# Patient Record
Sex: Female | Born: 1995 | Race: Black or African American | Hispanic: No | Marital: Single | Smoking: Never smoker
Health system: Southern US, Community
[De-identification: ages and names within clinical notes are randomized; demographics above are authoritative.]

## PROBLEM LIST (undated history)

## (undated) HISTORY — PX: NO PAST SURGERIES: SHX2092

---
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
## 2013-05-21 ENCOUNTER — Other Ambulatory Visit (HOSPITAL_COMMUNITY)
Admission: RE | Admit: 2013-05-21 | Discharge: 2013-05-21 | Disposition: A | Discharge status: Home / Self Care | Payer: BC Managed Care – PPO | Source: Ambulatory Visit | Attending: Family Medicine | Admitting: Family Medicine

## 2013-05-21 DIAGNOSIS — Z01419 Encounter for gynecological examination (general) (routine) without abnormal findings: Secondary | ICD-10-CM | POA: Insufficient documentation

## 2014-11-30 ENCOUNTER — Inpatient Hospital Stay (HOSPITAL_COMMUNITY): Admission: AD | Admit: 2014-11-30 | Payer: BC Managed Care – PPO | Admitting: Family Medicine

## 2014-11-30 ENCOUNTER — Emergency Department (HOSPITAL_COMMUNITY)
Admission: EM | Admit: 2014-11-30 | Discharge: 2014-11-30 | Disposition: A | Discharge status: Home / Self Care | Payer: Private Health Insurance - Indemnity | Attending: Emergency Medicine | Admitting: Emergency Medicine

## 2014-11-30 ENCOUNTER — Encounter (HOSPITAL_COMMUNITY): Payer: Self-pay | Admitting: Nurse Practitioner

## 2014-11-30 DIAGNOSIS — Z3202 Encounter for pregnancy test, result negative: Secondary | ICD-10-CM | POA: Insufficient documentation

## 2014-11-30 DIAGNOSIS — R630 Anorexia: Secondary | ICD-10-CM | POA: Diagnosis not present

## 2014-11-30 DIAGNOSIS — R109 Unspecified abdominal pain: Secondary | ICD-10-CM | POA: Diagnosis not present

## 2014-11-30 DIAGNOSIS — R110 Nausea: Secondary | ICD-10-CM | POA: Diagnosis not present

## 2014-11-30 DIAGNOSIS — R51 Headache: Secondary | ICD-10-CM | POA: Diagnosis not present

## 2014-11-30 DIAGNOSIS — R42 Dizziness and giddiness: Secondary | ICD-10-CM | POA: Insufficient documentation

## 2014-11-30 DIAGNOSIS — R531 Weakness: Secondary | ICD-10-CM | POA: Insufficient documentation

## 2014-11-30 DIAGNOSIS — R5383 Other fatigue: Secondary | ICD-10-CM | POA: Diagnosis not present

## 2014-11-30 LAB — CBC WITH DIFFERENTIAL/PLATELET
BASOS ABS: 0 10*3/uL (ref 0.0–0.1)
Basophils Relative: 1 % (ref 0–1)
Eosinophils Absolute: 0 10*3/uL (ref 0.0–0.7)
Eosinophils Relative: 1 % (ref 0–5)
HCT: 37.8 % (ref 36.0–46.0)
Hemoglobin: 12.1 g/dL (ref 12.0–15.0)
Lymphocytes Relative: 41 % (ref 12–46)
Lymphs Abs: 1.8 10*3/uL (ref 0.7–4.0)
MCH: 25.4 pg — ABNORMAL LOW (ref 26.0–34.0)
MCHC: 32 g/dL (ref 30.0–36.0)
MCV: 79.4 fL (ref 78.0–100.0)
Monocytes Absolute: 0.3 10*3/uL (ref 0.1–1.0)
Monocytes Relative: 8 % (ref 3–12)
NEUTROS ABS: 2.2 10*3/uL (ref 1.7–7.7)
Neutrophils Relative %: 49 % (ref 43–77)
Platelets: 232 10*3/uL (ref 150–400)
RBC: 4.76 MIL/uL (ref 3.87–5.11)
RDW: 15.5 % (ref 11.5–15.5)
WBC: 4.3 10*3/uL (ref 4.0–10.5)

## 2014-11-30 LAB — URINALYSIS, ROUTINE W REFLEX MICROSCOPIC
Bilirubin Urine: NEGATIVE
GLUCOSE, UA: NEGATIVE mg/dL
Hgb urine dipstick: NEGATIVE
KETONES UR: NEGATIVE mg/dL
Leukocytes, UA: NEGATIVE
Nitrite: NEGATIVE
Protein, ur: NEGATIVE mg/dL
Specific Gravity, Urine: 1.027 (ref 1.005–1.030)
Urobilinogen, UA: 0.2 mg/dL (ref 0.0–1.0)
pH: 7 (ref 5.0–8.0)

## 2014-11-30 LAB — I-STAT BETA HCG BLOOD, ED (MC, WL, AP ONLY): I-stat hCG, quantitative: <5 m[IU]/mL (ref <5)

## 2014-11-30 LAB — COMPREHENSIVE METABOLIC PANEL
ALT: 10 U/L (ref 0–35)
AST: 17 U/L (ref 0–37)
Albumin: 4.1 g/dL (ref 3.5–5.2)
Alkaline Phosphatase: 64 U/L (ref 39–117)
Anion gap: 13 (ref 5–15)
BILIRUBIN TOTAL: 0.3 mg/dL (ref 0.3–1.2)
BUN: 14 mg/dL (ref 6–23)
CO2: 24 meq/L (ref 19–32)
Calcium: 9.6 mg/dL (ref 8.4–10.5)
Chloride: 104 mEq/L (ref 96–112)
Creatinine, Ser: 0.64 mg/dL (ref 0.50–1.10)
GFR calc Af Amer: >90 mL/min (ref >90)
Glucose, Bld: 81 mg/dL (ref 70–99)
Potassium: 4.1 mEq/L (ref 3.7–5.3)
SODIUM: 141 meq/L (ref 137–147)
Total Protein: 8.4 g/dL — ABNORMAL HIGH (ref 6.0–8.3)

## 2014-11-30 LAB — POC URINE PREG, ED: Preg Test, Ur: NEGATIVE

## 2014-11-30 LAB — LIPASE, BLOOD: Lipase: 24 U/L (ref 11–59)

## 2014-11-30 MED ORDER — ONDANSETRON 4 MG PO TBDP
8.0000 mg | ORAL_TABLET | Freq: Once | ORAL | Status: AC
  Administered 2014-11-30: 8 mg via ORAL
  Filled 2014-11-30: qty 2

## 2014-11-30 NOTE — ED Notes (Signed)
Called lab to verify that they would add on Lipase

## 2014-11-30 NOTE — ED Notes (Signed)
NAD present. Pt is stable.

## 2014-11-30 NOTE — ED Provider Notes (Signed)
CSN: [id number]     Arrival date & time 11/30/14  1048 History   First MD Initiated Contact with Patient 11/30/14 1241     Chief Complaint  Patient presents with  . Nausea     (Consider location/radiation/quality/duration/timing/severity/associated sxs/prior Treatment) HPI Sharon Gross is a 18 y.o. female with no medical problems, presents to ED with complaint of nausea, headaches, dizziness, loss of appetite for about 2 wks. States currently on her period. Reports mild pain in right flank. No fever, chills. No urinary symptoms. No cough or congestion. No abdominal pain. No vomiting. Normal bowel movements. Denies pregnancy although is sexually active. State "I just dont feel well." reports sleeping a lot. Drinking fluids.   History reviewed. No pertinent past medical history. History reviewed. No pertinent past surgical history. History reviewed. No pertinent family history. History  Substance Use Topics  . Smoking status: Never Smoker   . Smokeless tobacco: Not on file  . Alcohol Use: No   OB History    No data available     Review of Systems  Constitutional: Positive for appetite change and fatigue. Negative for fever and chills.  Respiratory: Negative for cough, chest tightness and shortness of breath.   Cardiovascular: Negative for chest pain, palpitations and leg swelling.  Gastrointestinal: Positive for nausea. Negative for vomiting, abdominal pain and diarrhea.  Genitourinary: Positive for flank pain. Negative for dysuria, urgency, hematuria, vaginal bleeding, vaginal discharge, vaginal pain and pelvic pain.  Musculoskeletal: Negative for myalgias, arthralgias, neck pain and neck stiffness.  Skin: Negative for rash.  Neurological: Positive for dizziness, weakness, light-headedness and headaches.  All other systems reviewed and are negative.     Allergies  Review of patient's allergies indicates no known allergies.  Home Medications   Prior to Admission  medications   Not on File   BP 121/65 mmHg  Pulse 75  Temp(Src) 98.3 F (36.8 C) (Oral)  Resp 16  SpO2 100%  LMP 11/26/2014 Physical Exam  Constitutional: She appears well-developed and well-nourished. No distress.  HENT:  Head: Normocephalic.  Eyes: Conjunctivae are normal.  Neck: Neck supple.  Cardiovascular: Normal rate, regular rhythm and normal heart sounds.   Pulmonary/Chest: Effort normal and breath sounds normal. No respiratory distress. She has no wheezes. She has no rales.  Abdominal: Soft. Bowel sounds are normal. She exhibits no distension. There is no tenderness. There is no rebound and no guarding.  No CVA tenderness bilaterally  Musculoskeletal: She exhibits no edema.  Neurological: She is alert.  Skin: Skin is warm and dry.  Psychiatric: She has a normal mood and affect. Her behavior is normal.  Nursing note and vitals reviewed.   ED Course  Procedures (including critical care time) Labs Review Labs Reviewed  CBC WITH DIFFERENTIAL - Abnormal; Notable for the following:    MCH 25.4 (*)    All other components within normal limits  COMPREHENSIVE METABOLIC PANEL - Abnormal; Notable for the following:    Total Protein 8.4 (*)    All other components within normal limits  URINALYSIS, ROUTINE W REFLEX MICROSCOPIC  LIPASE, BLOOD  POC URINE PREG, ED  I-STAT BETA HCG BLOOD, ED ([facility], [facility], [facility] ONLY)    Imaging Review No results found.   EKG Interpretation None      MDM   Final diagnoses:  Nausea  Weakness     2:02 PM Pt's labs, ua, orthostatics negative. VS normal. Discussed with pt. Pt requesting me to speak with her aunt who [facility]. She is requesting HCG blood lab test. I explained to her it is not necessary given urine preg is negative and even if it returns positive she will still be d/c home with follow up outpatient. Pt requesting the test stating she does not want to leave until we run it. Will get POC HCG. No vaginal discharge  or bleeding at this time.    HCG negative. Pt stable for dc home at this time with outpatient follow up and further work up as needed.   Filed Vitals:   11/30/14 1415 11/30/14 1430 11/30/14 1445 11/30/14 1501  BP: 129/68   129/68  Pulse: 78 90 82 82  Temp:      TempSrc:      Resp:      SpO2: 99% 99% 100%      Lottie Musselatyana A Yolonda Purtle, PA-C 11/30/14 1611  Glynn OctaveStephen Rancour, MD 11/30/14 [phone]

## 2014-11-30 NOTE — Discharge Instructions (Signed)
Lab work and urine analysis are normal today. You are not pregnant. Make sure to eat well balanced meals. Drink plenty of fluids. Follow up with primary care doctor. Return if symptoms worsening.    Fatigue Fatigue is a feeling of tiredness, lack of energy, lack of motivation, or feeling tired all the time. Having enough rest, good nutrition, and reducing stress will normally reduce fatigue. Consult your caregiver if it persists. The nature of your fatigue will help your caregiver to find out its cause. The treatment is based on the cause.  CAUSES  There are many causes for fatigue. Most of the time, fatigue can be traced to one or more of your habits or routines. Most causes fit into one or more of three general areas. They are: Lifestyle problems  Sleep disturbances.  Overwork.  Physical exertion.  Unhealthy habits.  Poor eating habits or eating disorders.  Alcohol and/or drug use .  Lack of proper nutrition (malnutrition). Psychological problems  Stress and/or anxiety problems.  Depression.  Grief.  Boredom. Medical Problems or Conditions  Anemia.  Pregnancy.  Thyroid gland problems.  Recovery from major surgery.  Continuous pain.  Emphysema or asthma that is not well controlled  Allergic conditions.  Diabetes.  Infections (such as mononucleosis).  Obesity.  Sleep disorders, such as sleep apnea.  Heart failure or other heart-related problems.  Cancer.  Kidney disease.  Liver disease.  Effects of certain medicines such as antihistamines, cough and cold remedies, prescription pain medicines, heart and blood pressure medicines, drugs used for treatment of cancer, and some antidepressants. SYMPTOMS  The symptoms of fatigue include:   Lack of energy.  Lack of drive (motivation).  Drowsiness.  Feeling of indifference to the surroundings. DIAGNOSIS  The details of how you feel help guide your caregiver in finding out what is causing the fatigue.  You will be asked about your present and past health condition. It is important to review all medicines that you take, including prescription and non-prescription items. A thorough exam will be done. You will be questioned about your feelings, habits, and normal lifestyle. Your caregiver may suggest blood tests, urine tests, or other tests to look for common medical causes of fatigue.  TREATMENT  Fatigue is treated by correcting the underlying cause. For example, if you have continuous pain or depression, treating these causes will improve how you feel. Similarly, adjusting the dose of certain medicines will help in reducing fatigue.  HOME CARE INSTRUCTIONS   Try to get the required amount of good sleep every night.  Eat a healthy and nutritious diet, and drink enough water throughout the day.  Practice ways of relaxing (including yoga or meditation).  Exercise regularly.  Make plans to change situations that cause stress. Act on those plans so that stresses decrease over time. Keep your work and personal routine reasonable.  Avoid street drugs and minimize use of alcohol.  Start taking a daily multivitamin after consulting your caregiver. SEEK MEDICAL CARE IF:   You have persistent tiredness, which cannot be accounted for.  You have fever.  You have unintentional weight loss.  You have headaches.  You have disturbed sleep throughout the night.  You are feeling sad.  You have constipation.  You have dry skin.  You have gained weight.  You are taking any new or different medicines that you suspect are causing fatigue.  You are unable to sleep at night.  You develop any unusual swelling of your legs or other parts of your  body. SEEK IMMEDIATE MEDICAL CARE IF:   You are feeling confused.  Your vision is blurred.  You feel faint or pass out.  You develop severe headache.  You develop severe abdominal, pelvic, or back pain.  You develop chest pain, shortness of  breath, or an irregular or fast heartbeat.  You are unable to pass a normal amount of urine.  You develop abnormal bleeding such as bleeding from the rectum or you vomit blood.  You have thoughts about harming yourself or committing suicide.  You are worried that you might harm someone else. MAKE SURE YOU:   Understand these instructions.  Will watch your condition.  Will get help right away if you are not doing well or get worse. Document Released: 10/01/2007 Document Revised: 02/26/2012 Document Reviewed: 04/07/2014 [facility] Patient Information 2015 [employer] [place]. This information is not intended to replace advice given to you by your health care provider. Make sure you discuss any questions you have with your health care provider.

## 2014-11-30 NOTE — ED Notes (Addendum)
She is tearful, states shes been having nausea, headaches, dizziness, loss of appetite and just not feeling well this week. She reports she started her period early and it was lighter than normal and a different color. She started bleeding Thursday and stopped yesterday. She is A&Ox4, resp e/u

## 2018-07-05 ENCOUNTER — Emergency Department (HOSPITAL_BASED_OUTPATIENT_CLINIC_OR_DEPARTMENT_OTHER)
Admission: EM | Admit: 2018-07-05 | Discharge: 2018-07-05 | Disposition: A | Discharge status: Home / Self Care | Payer: BLUE CROSS/BLUE SHIELD | Attending: Emergency Medicine | Admitting: Emergency Medicine

## 2018-07-05 ENCOUNTER — Other Ambulatory Visit: Payer: Self-pay

## 2018-07-05 ENCOUNTER — Encounter (HOSPITAL_BASED_OUTPATIENT_CLINIC_OR_DEPARTMENT_OTHER): Payer: Self-pay | Admitting: Emergency Medicine

## 2018-07-05 ENCOUNTER — Emergency Department (HOSPITAL_BASED_OUTPATIENT_CLINIC_OR_DEPARTMENT_OTHER): Payer: BLUE CROSS/BLUE SHIELD

## 2018-07-05 DIAGNOSIS — Z3202 Encounter for pregnancy test, result negative: Secondary | ICD-10-CM | POA: Diagnosis not present

## 2018-07-05 DIAGNOSIS — K8020 Calculus of gallbladder without cholecystitis without obstruction: Secondary | ICD-10-CM

## 2018-07-05 DIAGNOSIS — R1011 Right upper quadrant pain: Secondary | ICD-10-CM

## 2018-07-05 DIAGNOSIS — R1013 Epigastric pain: Secondary | ICD-10-CM | POA: Diagnosis present

## 2018-07-05 LAB — CBC
HCT: 39.4 % (ref 36.0–46.0)
HEMOGLOBIN: 13.1 g/dL (ref 12.0–15.0)
MCH: 27.9 pg (ref 26.0–34.0)
MCHC: 33.2 g/dL (ref 30.0–36.0)
MCV: 83.8 fL (ref 78.0–100.0)
Platelets: 161 10*3/uL (ref 150–400)
RBC: 4.7 MIL/uL (ref 3.87–5.11)
RDW: 15 % (ref 11.5–15.5)
WBC: 8.2 10*3/uL (ref 4.0–10.5)

## 2018-07-05 LAB — URINALYSIS, ROUTINE W REFLEX MICROSCOPIC
Glucose, UA: NEGATIVE mg/dL
Hgb urine dipstick: NEGATIVE
Ketones, ur: NEGATIVE mg/dL
NITRITE: NEGATIVE
Protein, ur: NEGATIVE mg/dL
Specific Gravity, Urine: 1.025 (ref 1.005–1.030)
pH: 6 (ref 5.0–8.0)

## 2018-07-05 LAB — COMPREHENSIVE METABOLIC PANEL
ALT: 128 U/L — AB (ref 0–44)
AST: 318 U/L — ABNORMAL HIGH (ref 15–41)
Albumin: 4 g/dL (ref 3.5–5.0)
Alkaline Phosphatase: 99 U/L (ref 38–126)
Anion gap: 9 (ref 5–15)
BUN: 15 mg/dL (ref 6–20)
CO2: 24 mmol/L (ref 22–32)
Calcium: 9.4 mg/dL (ref 8.9–10.3)
Chloride: 106 mmol/L (ref 98–111)
Creatinine, Ser: 0.76 mg/dL (ref 0.44–1.00)
GFR calc Af Amer: >60 mL/min (ref >60)
Glucose, Bld: 144 mg/dL — ABNORMAL HIGH (ref 70–99)
Potassium: 4.5 mmol/L (ref 3.5–5.1)
SODIUM: 139 mmol/L (ref 135–145)
TOTAL PROTEIN: 8.1 g/dL (ref 6.5–8.1)
Total Bilirubin: 1.2 mg/dL (ref 0.3–1.2)

## 2018-07-05 LAB — LIPASE, BLOOD: Lipase: 25 U/L (ref 11–51)

## 2018-07-05 LAB — URINALYSIS, MICROSCOPIC (REFLEX)

## 2018-07-05 LAB — PREGNANCY, URINE: PREG TEST UR: NEGATIVE

## 2018-07-05 MED ORDER — GI COCKTAIL ~~LOC~~
30.0000 mL | Freq: Once | ORAL | Status: AC
  Administered 2018-07-05: 30 mL via ORAL
  Filled 2018-07-05: qty 30

## 2018-07-05 MED ORDER — ONDANSETRON 4 MG PO TBDP
4.0000 mg | ORAL_TABLET | Freq: Once | ORAL | Status: AC
  Administered 2018-07-05: 4 mg via ORAL
  Filled 2018-07-05: qty 1

## 2018-07-05 NOTE — Discharge Instructions (Signed)
Follow up with a general surgeon.  You may want to avoid fatty foods.   Try zantac twice a day for the next week or so.

## 2018-07-05 NOTE — ED Triage Notes (Signed)
Reports sent from [facility] with upper abdominal pain x 2 days.  Denies N/V/D.

## 2018-07-05 NOTE — ED Notes (Signed)
Pt returned from US

## 2018-07-05 NOTE — ED Provider Notes (Signed)
[facility] EMERGENCY DEPARTMENT Provider Note   CSN: [id number] Arrival date & time: 07/05/18  1458     History   Chief Complaint Chief Complaint  Patient presents with  . Abdominal Pain    HPI Sharon Gross is a 22 y.o. female.  22 yo F with a chief complaint of epigastric abdominal pain.  Going on since yesterday.  Worse with lying flat.  Denies fevers, vomiting.  No prior abdominal surgery.  Denies trauma.    The history is provided by the patient.  Abdominal Pain   This is a new problem. The current episode started yesterday. The problem occurs constantly. The problem has not changed since onset.The pain is located in the epigastric region. The quality of the pain is sharp and aching. The pain is at a severity of 8/10. The pain is moderate. Pertinent negatives include fever, nausea, vomiting, dysuria, headaches, arthralgias and myalgias. Nothing aggravates the symptoms. Nothing relieves the symptoms.    History reviewed. No pertinent past medical history.  There are no active problems to display for this patient.   History reviewed. No pertinent surgical history.   OB History   None      Home Medications    Prior to Admission medications   Not on File    Family History History reviewed. No pertinent family history.  Social History Social History   Tobacco Use  . Smoking status: Never Smoker  Substance Use Topics  . Alcohol use: No  . Drug use: No     Allergies   Patient has no known allergies.   Review of Systems Review of Systems  Constitutional: Negative for chills and fever.  HENT: Negative for congestion and rhinorrhea.   Eyes: Negative for redness and visual disturbance.  Respiratory: Negative for shortness of breath and wheezing.   Cardiovascular: Negative for chest pain and palpitations.  Gastrointestinal: Positive for abdominal distention and abdominal pain. Negative for nausea and vomiting.  Genitourinary: Negative for  dysuria and urgency.  Musculoskeletal: Negative for arthralgias and myalgias.  Skin: Negative for pallor and wound.  Neurological: Negative for dizziness and headaches.     Physical Exam Updated Vital Signs BP 131/85 (BP Location: Right Arm)   Pulse 82   Temp 98.6 F (37 C) (Oral)   Resp 18   Ht 5\' 7"  (1.702 m)   Wt 79.8 kg (176 lb)   LMP 06/21/2018 (Exact Date)   SpO2 100%   BMI 27.57 kg/m   Physical Exam  Constitutional: She is oriented to person, place, and time. She appears well-developed and well-nourished. No distress.  HENT:  Head: Normocephalic and atraumatic.  Eyes: Pupils are equal, round, and reactive to light. EOM are normal.  Neck: Normal range of motion. Neck supple.  Cardiovascular: Normal rate and regular rhythm. Exam reveals no gallop and no friction rub.  No murmur heard. Pulmonary/Chest: Effort normal. She has no wheezes. She has no rales.  Abdominal: Soft. She exhibits no distension and no mass. There is tenderness (mild epigastric). There is no guarding.  No RUQ pain negative murphys   Musculoskeletal: She exhibits no edema or tenderness.  Neurological: She is alert and oriented to person, place, and time.  Skin: Skin is warm and dry. She is not diaphoretic.  Psychiatric: She has a normal mood and affect. Her behavior is normal.  Nursing note and vitals reviewed.    ED Treatments / Results  Labs (all labs ordered are listed, but only abnormal results are displayed) Labs  Reviewed  COMPREHENSIVE METABOLIC PANEL - Abnormal; Notable for the following components:      Result Value   Glucose, Bld 144 (*)    AST 318 (*)    ALT 128 (*)    All other components within normal limits  URINALYSIS, ROUTINE W REFLEX MICROSCOPIC - Abnormal; Notable for the following components:   Bilirubin Urine SMALL (*)    Leukocytes, UA TRACE (*)    All other components within normal limits  URINALYSIS, MICROSCOPIC (REFLEX) - Abnormal; Notable for the following  components:   Bacteria, UA MANY (*)    All other components within normal limits  LIPASE, BLOOD  CBC  PREGNANCY, URINE    EKG None  Radiology Koreas Abdomen Limited Ruq  Result Date: 07/05/2018 CLINICAL DATA:  Epigastric region pain EXAM: ULTRASOUND ABDOMEN LIMITED RIGHT UPPER QUADRANT COMPARISON:  None. FINDINGS: Gallbladder: There are a few small echogenic foci in the gallbladder which move and shadow consistent with cholelithiasis. Largest gallstone measures 4 mm in length. No gallbladder wall thickening or pericholecystic fluid. No sonographic Murphy sign noted by sonographer. Common bile duct: Diameter: 3 mm. No intrahepatic or extrahepatic biliary duct dilatation. Liver: No focal lesion identified. Within normal limits in parenchymal echogenicity. Portal vein is patent on color Doppler imaging with normal direction of blood flow towards the liver. IMPRESSION: Cholelithiasis. No gallbladder wall thickening or pericholecystic fluid. Study otherwise unremarkable. Electronically Signed   By: Bretta BangWilliam  Woodruff III M.D.   On: 07/05/2018 18:35    Procedures Procedures (including critical care time)  Medications Ordered in ED Medications  ondansetron (ZOFRAN-ODT) disintegrating tablet 4 mg (4 mg Oral Given 07/05/18 1625)  gi cocktail (Maalox,Lidocaine,Donnatal) (30 mLs Oral Given 07/05/18 1625)     Initial Impression / Assessment and Plan / ED Course  I have reviewed the triage vital signs and the nursing notes.  Pertinent labs & imaging results that were available during my care of the patient were reviewed by me and considered in my medical decision making (see chart for details).  Clinical Course as of Jul 05 1850  Fri Jul 05, 2018  1746 Patient has mild AST and ALT elevation not seen on prior will obtain right upper quadrant ultrasound patient reassessed and feels completely better after GI cocktail.  UA with too numerous to count bacteria but otherwise unremarkable I doubt this is a  urinary tract infection.  CBC without anemia lipase normal   [DF]    Clinical Course User Index [DF] Melene PlanFloyd, Masyn Fullam, DO    22 yo F with a chief complaint of epigastric abdominal pain.  This been going on since yesterday.  Sounds like reflux by history.  Exam with no right upper quadrant tenderness negative Murphy sign.  Will obtain abdominal labs give a GI cocktail and reassess.  Patient has some LFT elevation, ultrasound ordered and negative for acute cholecystitis.  White blood cell count normal pain significantly improved with GI cocktail.  Suspect patient's symptoms are most likely reflux but with gallstones on ultrasound we will have her follow-up with general surgery.  6:51 PM:  I have discussed the diagnosis/risks/treatment options with the patient and family and believe the pt to be eligible for discharge home to follow-up with PCP, Gen surgery. We also discussed returning to the ED immediately if new or worsening sx occur. We discussed the sx which are most concerning (e.g., sudden worsening pain, fever, inability to tolerate by mouth) that necessitate immediate return. Medications administered to the patient during their visit and  any new prescriptions provided to the patient are listed below.  Medications given during this visit Medications  ondansetron (ZOFRAN-ODT) disintegrating tablet 4 mg (4 mg Oral Given 07/05/18 1625)  gi cocktail (Maalox,Lidocaine,Donnatal) (30 mLs Oral Given 07/05/18 1625)      The patient appears reasonably screen and/or stabilized for discharge and I doubt any other medical condition or other [facility] requiring further screening, evaluation, or treatment in the ED at this time prior to discharge.    Final Clinical Impressions(s) / ED Diagnoses   Final diagnoses:  Abdominal pain, RUQ  Calculus of gallbladder without cholecystitis without obstruction    ED Discharge Orders    None       Melene Plan, DO 07/05/18 1851

## 2018-07-05 NOTE — ED Notes (Signed)
Patient transported to Ultrasound 

## 2018-07-09 ENCOUNTER — Ambulatory Visit: Payer: Self-pay | Admitting: General Surgery

## 2018-07-11 ENCOUNTER — Other Ambulatory Visit: Payer: Self-pay

## 2018-07-11 ENCOUNTER — Encounter (HOSPITAL_COMMUNITY): Payer: Self-pay | Admitting: *Deleted

## 2018-07-11 MED ORDER — CEFOTETAN DISODIUM 2 G IJ SOLR
2.0000 g | INTRAMUSCULAR | Status: AC
  Administered 2018-07-12: 2 g via INTRAVENOUS
  Filled 2018-07-11: qty 2

## 2018-07-11 MED ORDER — CEFOTETAN DISODIUM 2 G IJ SOLR
2.0000 g | INTRAMUSCULAR | Status: DC
  Filled 2018-07-11: qty 2

## 2018-07-12 ENCOUNTER — Ambulatory Visit (HOSPITAL_COMMUNITY): Payer: BLUE CROSS/BLUE SHIELD

## 2018-07-12 ENCOUNTER — Ambulatory Visit (HOSPITAL_COMMUNITY): Payer: BLUE CROSS/BLUE SHIELD | Admitting: Anesthesiology

## 2018-07-12 ENCOUNTER — Encounter (HOSPITAL_COMMUNITY)
Admission: RE | Disposition: A | Discharge status: Home / Self Care | Payer: Self-pay | Source: Ambulatory Visit | Attending: General Surgery

## 2018-07-12 ENCOUNTER — Ambulatory Visit (HOSPITAL_COMMUNITY)
Admission: RE | Admit: 2018-07-12 | Discharge: 2018-07-12 | Disposition: A | Discharge status: Home / Self Care | Payer: BLUE CROSS/BLUE SHIELD | Source: Ambulatory Visit | Attending: General Surgery | Admitting: General Surgery

## 2018-07-12 DIAGNOSIS — Z419 Encounter for procedure for purposes other than remedying health state, unspecified: Secondary | ICD-10-CM

## 2018-07-12 DIAGNOSIS — Z87891 Personal history of nicotine dependence: Secondary | ICD-10-CM | POA: Diagnosis not present

## 2018-07-12 DIAGNOSIS — K8010 Calculus of gallbladder with chronic cholecystitis without obstruction: Secondary | ICD-10-CM | POA: Diagnosis not present

## 2018-07-12 DIAGNOSIS — K8020 Calculus of gallbladder without cholecystitis without obstruction: Secondary | ICD-10-CM | POA: Diagnosis present

## 2018-07-12 HISTORY — PX: CHOLECYSTECTOMY: SHX55

## 2018-07-12 LAB — POCT PREGNANCY, URINE: PREG TEST UR: NEGATIVE

## 2018-07-12 SURGERY — LAPAROSCOPIC CHOLECYSTECTOMY WITH INTRAOPERATIVE CHOLANGIOGRAM
Anesthesia: General | Site: Abdomen

## 2018-07-12 MED ORDER — ROCURONIUM BROMIDE 100 MG/10ML IV SOLN
INTRAVENOUS | Status: DC | PRN
  Administered 2018-07-12: 50 mg via INTRAVENOUS

## 2018-07-12 MED ORDER — OXYCODONE HCL 5 MG PO TABS: 5.0000 mg | ORAL_TABLET | Freq: Four times a day (QID) | ORAL | 0 refills | Status: AC | PRN

## 2018-07-12 MED ORDER — BUPIVACAINE-EPINEPHRINE (PF) 0.25% -1:200000 IJ SOLN
INTRAMUSCULAR | Status: AC
  Filled 2018-07-12: qty 30

## 2018-07-12 MED ORDER — OXYCODONE HCL 5 MG PO TABS
5.0000 mg | ORAL_TABLET | Freq: Once | ORAL | Status: AC | PRN
Start: 2018-07-12 — End: 2018-07-12
  Administered 2018-07-12: 5 mg via ORAL

## 2018-07-12 MED ORDER — KETAMINE HCL 10 MG/ML IJ SOLN
INTRAMUSCULAR | Status: DC | PRN
  Administered 2018-07-12 (×2): 10 mg via INTRAVENOUS

## 2018-07-12 MED ORDER — DEXAMETHASONE SODIUM PHOSPHATE 10 MG/ML IJ SOLN
INTRAMUSCULAR | Status: DC | PRN
  Administered 2018-07-12: 10 mg via INTRAVENOUS

## 2018-07-12 MED ORDER — PHENYLEPHRINE 40 MCG/ML (10ML) SYRINGE FOR IV PUSH (FOR BLOOD PRESSURE SUPPORT)
PREFILLED_SYRINGE | INTRAVENOUS | Status: AC
  Filled 2018-07-12: qty 10

## 2018-07-12 MED ORDER — KETAMINE HCL 50 MG/5ML IJ SOSY
PREFILLED_SYRINGE | INTRAMUSCULAR | Status: AC
  Filled 2018-07-12: qty 5

## 2018-07-12 MED ORDER — OXYCODONE HCL 5 MG/5ML PO SOLN: 5.0000 mg | Freq: Once | ORAL | Status: AC | PRN

## 2018-07-12 MED ORDER — GABAPENTIN 300 MG PO CAPS
300.0000 mg | ORAL_CAPSULE | ORAL | Status: AC
  Administered 2018-07-12: 300 mg via ORAL
  Filled 2018-07-12: qty 1

## 2018-07-12 MED ORDER — FENTANYL CITRATE (PF) 100 MCG/2ML IJ SOLN
INTRAMUSCULAR | Status: AC
  Filled 2018-07-12: qty 2

## 2018-07-12 MED ORDER — CEFOXITIN SODIUM-DEXTROSE 1-4 GM-%(50ML) IV SOLR (PREMIX)
INTRAVENOUS | Status: AC
  Filled 2018-07-12: qty 50

## 2018-07-12 MED ORDER — GLYCOPYRROLATE 0.2 MG/ML IJ SOLN
INTRAMUSCULAR | Status: DC | PRN
  Administered 2018-07-12: 0.1 mg via INTRAVENOUS

## 2018-07-12 MED ORDER — PROPOFOL 10 MG/ML IV BOLUS
INTRAVENOUS | Status: DC | PRN
  Administered 2018-07-12: 200 mg via INTRAVENOUS

## 2018-07-12 MED ORDER — MIDAZOLAM HCL 2 MG/2ML IJ SOLN
INTRAMUSCULAR | Status: AC
  Filled 2018-07-12: qty 2

## 2018-07-12 MED ORDER — EPHEDRINE SULFATE 50 MG/ML IJ SOLN
INTRAMUSCULAR | Status: AC
  Filled 2018-07-12: qty 1

## 2018-07-12 MED ORDER — PROPOFOL 10 MG/ML IV BOLUS
INTRAVENOUS | Status: AC
  Filled 2018-07-12: qty 20

## 2018-07-12 MED ORDER — OXYCODONE HCL 5 MG PO TABS
ORAL_TABLET | ORAL | Status: AC
  Filled 2018-07-12: qty 1

## 2018-07-12 MED ORDER — FENTANYL CITRATE (PF) 100 MCG/2ML IJ SOLN
25.0000 ug | INTRAMUSCULAR | Status: DC | PRN
Start: 2018-07-12 — End: 2018-07-12
  Administered 2018-07-12: 50 ug via INTRAVENOUS

## 2018-07-12 MED ORDER — CELECOXIB 200 MG PO CAPS
200.0000 mg | ORAL_CAPSULE | ORAL | Status: AC
  Administered 2018-07-12: 200 mg via ORAL
  Filled 2018-07-12: qty 1

## 2018-07-12 MED ORDER — BUPIVACAINE-EPINEPHRINE 0.25% -1:200000 IJ SOLN
INTRAMUSCULAR | Status: DC | PRN
  Administered 2018-07-12: 30 mL

## 2018-07-12 MED ORDER — IOPAMIDOL (ISOVUE-300) INJECTION 61%
INTRAVENOUS | Status: AC
  Filled 2018-07-12: qty 50

## 2018-07-12 MED ORDER — 0.9 % SODIUM CHLORIDE (POUR BTL) OPTIME
TOPICAL | Status: DC | PRN
  Administered 2018-07-12: 1000 mL

## 2018-07-12 MED ORDER — LACTATED RINGERS IV SOLN
INTRAVENOUS | Status: DC
  Administered 2018-07-12 (×3): via INTRAVENOUS

## 2018-07-12 MED ORDER — PROPOFOL 500 MG/50ML IV EMUL
INTRAVENOUS | Status: DC | PRN
  Administered 2018-07-12: 25 ug/kg/min via INTRAVENOUS

## 2018-07-12 MED ORDER — SODIUM CHLORIDE 0.9 % IV SOLN
INTRAVENOUS | Status: DC | PRN
  Administered 2018-07-12: 10:00:00

## 2018-07-12 MED ORDER — SUGAMMADEX SODIUM 200 MG/2ML IV SOLN
INTRAVENOUS | Status: DC | PRN
  Administered 2018-07-12: 200 mg via INTRAVENOUS

## 2018-07-12 MED ORDER — CHLORHEXIDINE GLUCONATE CLOTH 2 % EX PADS: 6.0000 | MEDICATED_PAD | Freq: Once | CUTANEOUS | Status: DC

## 2018-07-12 MED ORDER — ONDANSETRON HCL 4 MG/2ML IJ SOLN
INTRAMUSCULAR | Status: DC | PRN
  Administered 2018-07-12: 4 mg via INTRAVENOUS

## 2018-07-12 MED ORDER — FENTANYL CITRATE (PF) 250 MCG/5ML IJ SOLN
INTRAMUSCULAR | Status: AC
  Filled 2018-07-12: qty 5

## 2018-07-12 MED ORDER — SODIUM CHLORIDE 0.9 % IR SOLN
Status: DC | PRN
  Administered 2018-07-12: 1000 mL

## 2018-07-12 MED ORDER — LIDOCAINE HCL (CARDIAC) PF 100 MG/5ML IV SOSY
PREFILLED_SYRINGE | INTRAVENOUS | Status: DC | PRN
  Administered 2018-07-12: 100 mg via INTRAVENOUS

## 2018-07-12 MED ORDER — FENTANYL CITRATE (PF) 100 MCG/2ML IJ SOLN
INTRAMUSCULAR | Status: DC | PRN
  Administered 2018-07-12: 150 ug via INTRAVENOUS
  Administered 2018-07-12: 25 ug via INTRAVENOUS

## 2018-07-12 MED ORDER — PROMETHAZINE HCL 25 MG/ML IJ SOLN: 6.2500 mg | INTRAMUSCULAR | Status: DC | PRN

## 2018-07-12 MED ORDER — ACETAMINOPHEN 500 MG PO TABS
1000.0000 mg | ORAL_TABLET | ORAL | Status: AC
  Administered 2018-07-12: 1000 mg via ORAL
  Filled 2018-07-12: qty 2

## 2018-07-12 MED ORDER — MIDAZOLAM HCL 5 MG/5ML IJ SOLN
INTRAMUSCULAR | Status: DC | PRN
  Administered 2018-07-12: 2 mg via INTRAVENOUS

## 2018-07-12 SURGICAL SUPPLY — 43 items
APPLIER CLIP 5 13 M/L LIGAMAX5 (MISCELLANEOUS) ×3
BLADE CLIPPER SURG (BLADE) ×3 IMPLANT
CANISTER SUCT 3000ML PPV (MISCELLANEOUS) ×3 IMPLANT
CHLORAPREP W/TINT 26ML (MISCELLANEOUS) ×3 IMPLANT
CLIP APPLIE 5 13 M/L LIGAMAX5 (MISCELLANEOUS) ×1 IMPLANT
CLOSURE WOUND 1/2 X4 (GAUZE/BANDAGES/DRESSINGS) ×1
COVER MAYO STAND STRL (DRAPES) ×3 IMPLANT
COVER SURGICAL LIGHT HANDLE (MISCELLANEOUS) ×3 IMPLANT
DERMABOND ADVANCED (GAUZE/BANDAGES/DRESSINGS) ×2
DERMABOND ADVANCED .7 DNX12 (GAUZE/BANDAGES/DRESSINGS) ×1 IMPLANT
DRAPE C-ARM 42X72 X-RAY (DRAPES) ×3 IMPLANT
DRSG TEGADERM 2-3/8X2-3/4 SM (GAUZE/BANDAGES/DRESSINGS) ×3 IMPLANT
ELECT REM PT RETURN 9FT ADLT (ELECTROSURGICAL) ×3
ELECTRODE REM PT RTRN 9FT ADLT (ELECTROSURGICAL) ×1 IMPLANT
GLOVE BIOGEL PI IND STRL 6 (GLOVE) ×1 IMPLANT
GLOVE BIOGEL PI IND STRL 8 (GLOVE) ×2 IMPLANT
GLOVE BIOGEL PI INDICATOR 6 (GLOVE) ×2
GLOVE BIOGEL PI INDICATOR 8 (GLOVE) ×4
GLOVE ECLIPSE 7.5 STRL STRAW (GLOVE) ×3 IMPLANT
GOWN STRL REUS W/ TWL LRG LVL3 (GOWN DISPOSABLE) ×3 IMPLANT
GOWN STRL REUS W/ TWL XL LVL3 (GOWN DISPOSABLE) ×1 IMPLANT
GOWN STRL REUS W/TWL LRG LVL3 (GOWN DISPOSABLE) ×6
GOWN STRL REUS W/TWL XL LVL3 (GOWN DISPOSABLE) ×2
KIT BASIN OR (CUSTOM PROCEDURE TRAY) ×3 IMPLANT
KIT TURNOVER KIT B (KITS) ×3 IMPLANT
NS IRRIG 1000ML POUR BTL (IV SOLUTION) ×3 IMPLANT
PAD ARMBOARD 7.5X6 YLW CONV (MISCELLANEOUS) ×3 IMPLANT
POUCH RETRIEVAL ECOSAC 10 (ENDOMECHANICALS) ×1 IMPLANT
POUCH RETRIEVAL ECOSAC 10MM (ENDOMECHANICALS) ×2
SCISSORS LAP 5X35 DISP (ENDOMECHANICALS) ×3 IMPLANT
SET CHOLANGIOGRAPH 5 50 .035 (SET/KITS/TRAYS/PACK) ×3 IMPLANT
SET IRRIG TUBING LAPAROSCOPIC (IRRIGATION / IRRIGATOR) ×3 IMPLANT
SLEEVE ENDOPATH XCEL 5M (ENDOMECHANICALS) ×6 IMPLANT
SPECIMEN JAR SMALL (MISCELLANEOUS) ×3 IMPLANT
STRIP CLOSURE SKIN 1/2X4 (GAUZE/BANDAGES/DRESSINGS) ×2 IMPLANT
SUT MNCRL AB 4-0 PS2 18 (SUTURE) ×3 IMPLANT
TOWEL OR 17X24 6PK STRL BLUE (TOWEL DISPOSABLE) IMPLANT
TOWEL OR 17X26 10 PK STRL BLUE (TOWEL DISPOSABLE) ×3 IMPLANT
TRAY LAPAROSCOPIC MC (CUSTOM PROCEDURE TRAY) ×3 IMPLANT
TROCAR XCEL BLUNT TIP 100MML (ENDOMECHANICALS) ×3 IMPLANT
TROCAR XCEL NON-BLD 5MMX100MML (ENDOMECHANICALS) ×3 IMPLANT
TUBING INSUFFLATION (TUBING) ×3 IMPLANT
WATER STERILE IRR 1000ML POUR (IV SOLUTION) ×3 IMPLANT

## 2018-07-12 NOTE — Op Note (Addendum)
OPERATIVE REPORT  DATE OF OPERATION: 07/12/2018  PATIENT:  Sharon Gross  22 y.o. female  PRE-OPERATIVE DIAGNOSIS:  Symptomatic cholelithiasis  POST-OPERATIVE DIAGNOSIS:  Symptomatic cholelithiasis  INDICATION(S) FOR OPERATION:  Symptomatic cholelithiasis with abnormal LFTs  FINDINGS:  IOC not normal but no intraductal filling defects  PROCEDURE:  Procedure(s): LAPAROSCOPIC CHOLECYSTECTOMY WITH INTRAOPERATIVE CHOLANGIOGRAM ERAS PATHWAY  SURGEON:  Surgeon(s): Jimmye NormanWyatt, Savaya Hakes, MD  ASSISTANT: None  ANESTHESIA:   general  COMPLICATIONS:  None  EBL: <20 ml  BLOOD ADMINISTERED: none  DRAINS: none   SPECIMEN:  Source of Specimen:  Gallbladder and contents  COUNTS CORRECT:  YES  PROCEDURE DETAILS: The patient was taken to the operating room and placed on the table in the supine position.  After an adequate endotracheal anesthetic was administered, the patient was prepped with ChloroPrep, and then draped in the usual manner exposing the entire abdomen laterally, inferiorly and up  to the costal margins.  After a proper timeout was performed including identifying the patient and the procedure to be performed, a supraumbilical 1.5cm midline incision was made using a #15 blade.  This was taken down to the fascia which was then incised with a #15 blade.  The edges of the fascia were tented up with Kocher clamps as the preperitoneal space was penetrated with a Kelly clamp into the peritoneum.  Once this was done, a pursestring suture of 0 Vicryl was passed around the fascial opening.  This was subsequently used to secure the [facility] cannula which was passed into the peritoneal cavity.  Once the [facility] cannula was in place, carbon dioxide gas was insufflated into the peritoneal cavity up to a maximal intra-abdominal pressure of 15mm Hg.The laparoscope, with attached camera and light source, was passed into the peritoneal cavity to visualize the direct insertion of two right upper quadrant  5mm cannulas, and a sup-xiphoid 5mm cannula.  Once all cannulas were in place, the dissection was begun.  Two ratcheted graspers were attached to the dome and infundibulum of the gallbladder and retracted towards the anterior abdominal wall and the right upper quadrant.  Using cautery attached to a dissecting forceps, the peritoneum overlaying the triangle of Chalot and the hepatoduodenal triangle was dissected away exposing the cystic duct and the cystic artery.  A critical window was developed between the CBD and the cystic duct The cystic artery was clipped proximally and distally then transected.  A clip was placed on the gallbladder side of the cystic duct, then a cholecystodochotomy made using the laparoscopic scissors.  Through the cholecystodochotomy a Cook catheter was passed to performed a cholangiogram.  The cholangiogram showed Godd proximal and distal flow, but could not visualize the flow into the duodenum  No filling defects noted..  Once the cholangiogram was completed, the [facility] catheter was removed, and the distal cystic duct was clipped threetimes then transected between the clips.  The gallbladder was then dissected out of the hepatic bed without event.  It was retrieved from the abdomen using an EcoPouch bag without event.  Once the gallbladder was removed, the bed was inspected for hemostasis.  Once excellent hemostasis was obtained all gas and fluids were aspirated from above the liver, then the cannulas were removed.  The supraumbilical incision was closed using the pursestring suture which was in place.  0.25% bupivicaine with epinephrine was injected at all sites.  All 10mm or greater cannula sites were close using a running subcuticular stitch of 4-0 Monocryl.  5.270mm cannula sites were closed with Dermabond  only.Steri-Strips and Tagaderm were used to complete the dressings at all sites.  At this point all needle, sponge, and instrument counts were correct.The patient was awakened  from anesthesia and taken to the PACU in stable condition.      [name]. Gae Bon, MD, FACS [phone] [id number] [facility]  PATIENT DISPOSITION:  PACU - hemodynamically stable.   Jimmye Norman 7/26/201910:51 AM

## 2018-07-12 NOTE — Discharge Instructions (Addendum)
Laparoscopic Cholecystectomy, Care After This sheet gives you information about how to care for yourself after your procedure. Your doctor may also give you more specific instructions. If you have problems or questions, contact your doctor. Follow these instructions at home: Care for cuts from surgery (incisions)   Follow instructions from your doctor about how to take care of your cuts from surgery. Make sure you: ? Wash your hands with soap and water before you change your bandage (dressing). If you cannot use soap and water, use hand sanitizer. ? Change your bandage as told by your doctor. ? Leave stitches (sutures), skin glue, or skin tape (adhesive) strips in place. They may need to stay in place for 2 weeks or longer. If tape strips get loose and curl up, you may trim the loose edges. Do not remove tape strips completely unless your doctor says it is okay.  Do not take baths, swim, or use a hot tub until your doctor says it is okay. Ask your doctor if you can take showers. You may only be allowed to take sponge baths for bathing.  Check your surgical cut area every day for signs of infection. Check for: ? More redness, swelling, or pain. ? More fluid or blood. ? Warmth. ? Pus or a bad smell. Activity  Do not drive or use heavy machinery while taking prescription pain medicine.  Do not lift anything that is heavier than 10 lb (4.5 kg) until your doctor says it is okay.  Do not play contact sports until your doctor says it is okay.  Do not drive for 24 hours if you were given a medicine to help you relax (sedative).  Rest as needed. Do not return to work or school until your doctor says it is okay. General instructions  Take over-the-counter and prescription medicines only as told by your doctor.  To prevent or treat constipation while you are taking prescription pain medicine, your doctor may recommend that you: ? Drink enough fluid to keep your pee (urine) clear or pale  yellow. ? Take over-the-counter or prescription medicines. ? Eat foods that are high in fiber, such as fresh fruits and vegetables, whole grains, and beans. ? Limit foods that are high in fat and processed sugars, such as fried and sweet foods. ? Leave dressing intact until seen in clinic Contact a doctor if:  You develop a rash.  You have more redness, swelling, or pain around your surgical cuts.  You have more fluid or blood coming from your surgical cuts.  Your surgical cuts feel warm to the touch.  You have pus or a bad smell coming from your surgical cuts.  You have a fever.  One or more of your surgical cuts breaks open. Get help right away if:  You have trouble breathing.  You have chest pain.  You have pain that is getting worse in your shoulders.  You faint or feel dizzy when you stand.  You have very bad pain in your belly (abdomen).  You are sick to your stomach (nauseous) for more than one day.  You have throwing up (vomiting) that lasts for more than one day.  You have leg pain. This information is not intended to replace advice given to you by your health care provider. Make sure you discuss any questions you have with your health care provider. [name]. Gae Bon, MD, FACS [phone] [facility]  General Anesthesia, Adult, Care After These instructions provide you with information about  caring for yourself after your procedure. Your health care provider may also give you more specific instructions. Your treatment has been planned according to current medical practices, but problems sometimes occur. Call your health care provider if you have any problems or questions after your procedure. What can I expect after the procedure? After the procedure, it is common to have:  Vomiting.  A sore throat.  Mental slowness.  It is common to feel:  Nauseous.  Cold or shivery.  Sleepy.  Tired.  Sore or  achy, even in parts of your body where you did not have surgery.  Follow these instructions at home: For at least 24 hours after the procedure:  Do not: ? Participate in activities where you could fall or become injured. ? Drive. ? Use heavy machinery. ? Drink alcohol. ? Take sleeping pills or medicines that cause drowsiness. ? Make important decisions or sign legal documents. ? Take care of children on your own.  Rest. Eating and drinking  If you vomit, drink water, juice, or soup when you can drink without vomiting.  Drink enough fluid to keep your urine clear or pale yellow.  Make sure you have little or no nausea before eating solid foods.  Follow the diet recommended by your health care provider. General instructions  Have a responsible adult stay with you until you are awake and alert.  Return to your normal activities as told by your health care provider. Ask your health care provider what activities are safe for you.  Take over-the-counter and prescription medicines only as told by your health care provider.  If you smoke, do not smoke without supervision.  Keep all follow-up visits as told by your health care provider. This is important. Contact a health care provider if:  You continue to have nausea or vomiting at home, and medicines are not helpful.  You cannot drink fluids or start eating again.  You cannot urinate after 8-12 hours.  You develop a skin rash.  You have fever.  You have increasing redness at the site of your procedure. Get help right away if:  You have difficulty breathing.  You have chest pain.  You have unexpected bleeding.  You feel that you are having a life-threatening or urgent problem. This information is not intended to replace advice given to you by your health care provider. Make sure you discuss any questions you have with your health care provider.

## 2018-07-12 NOTE — Anesthesia Preprocedure Evaluation (Addendum)
Anesthesia Evaluation  Patient identified by MRN, date of birth, ID band Patient awake    Reviewed: Allergy & Precautions, NPO status , Patient's Chart, lab work & pertinent test results  History of Anesthesia Complications Negative for: history of anesthetic complications  Airway Mallampati: II  TM Distance: >3 FB Neck ROM: Full    Dental  (+) Dental Advisory Given, Teeth Intact   Pulmonary neg pulmonary ROS,    breath sounds clear to auscultation       Cardiovascular negative cardio ROS   Rhythm:Regular Rate:Normal     Neuro/Psych negative neurological ROS  negative psych ROS   GI/Hepatic Neg liver ROS,  Cholelithiasis    Endo/Other  negative endocrine ROS  Renal/GU negative Renal ROS  negative genitourinary   Musculoskeletal negative musculoskeletal ROS (+)   Abdominal   Peds  Hematology negative hematology ROS (+)   Anesthesia Other Findings   Reproductive/Obstetrics                            Anesthesia Physical Anesthesia Plan  ASA: I  Anesthesia Plan: General   Post-op Pain Management:    Induction: Intravenous  PONV Risk Score and Plan: 4 or greater and Treatment may vary due to age or medical condition, Ondansetron, Scopolamine patch - Pre-op, Midazolam and Dexamethasone  Airway Management Planned: Oral ETT  Additional Equipment: None  Intra-op Plan:   Post-operative Plan: Extubation in OR  Informed Consent: I have reviewed the patients History and Physical, chart, labs and discussed the procedure including the risks, benefits and alternatives for the proposed anesthesia with the patient or authorized representative who has indicated his/her understanding and acceptance.   Dental advisory given  Plan Discussed with: CRNA and Anesthesiologist  Anesthesia Plan Comments:         Anesthesia Quick Evaluation

## 2018-07-12 NOTE — Anesthesia Procedure Notes (Signed)
Procedure Name: Intubation Date/Time: 07/12/2018 9:50 AM Performed by: Lavell Luster, CRNA Pre-anesthesia Checklist: Patient identified, Emergency Drugs available, Suction available, Patient being monitored and Timeout performed Patient Re-evaluated:Patient Re-evaluated prior to induction Oxygen Delivery Method: Circle system utilized Preoxygenation: Pre-oxygenation with 100% oxygen Induction Type: IV induction Ventilation: Mask ventilation without difficulty Laryngoscope Size: Mac and 3 Grade View: Grade II Tube type: Oral Tube size: 7.0 mm Number of attempts: 2 Airway Equipment and Method: Stylet Placement Confirmation: ETT inserted through vocal cords under direct vision,  positive ETCO2 and breath sounds checked- equal and bilateral Secured at: 22 cm Tube secured with: Tape Dental Injury: Teeth and Oropharynx as per pre-operative assessment  Comments: DL x 1 by Wilhemina Bonito with Sabra Heck 2 with poor view of cords, repeat DL by SRNA with MAC 3 blade with good view.  ETT passed easily, verified placement by Dr Fransisco Beau .  Henderson Cloud, CRNA

## 2018-07-12 NOTE — Transfer of Care (Signed)
Immediate Anesthesia Transfer of Care Note  Patient: Sharon LoraCourtney M Grime  Procedure(s) Performed: LAPAROSCOPIC CHOLECYSTECTOMY WITH INTRAOPERATIVE CHOLANGIOGRAM ERAS PATHWAY (N/A Abdomen)  Patient Location: PACU  Anesthesia Type:General  Level of Consciousness: awake, alert  and sedated  Airway & Oxygen Therapy: Patient connected to face mask oxygen  Post-op Assessment: Post -op Vital signs reviewed and stable  Post vital signs: stable  Last Vitals:  Vitals Value Taken Time  BP    Temp    Pulse    Resp    SpO2      Last Pain:  Vitals:   07/12/18 0648  TempSrc: Oral      Patients Stated Pain Goal: 2 (07/12/18 0726)  Complications: No apparent anesthesia complications

## 2018-07-12 NOTE — H&P (Signed)
Sharon Gross Documented: 07/09/2018 8:51 AM Location: [facility] Patient #: [id number] DOB: 1996/05/26 Single / Language: [place] / Race: Black or African American Female   History of Present Illness Sharon Gross. Sharon Spruce Gross; 07/09/2018 9:23 AM  The patient is a 22 year old female who presents for evaluation of gall stones. The onset of the gall stones has been acute and has been occurring in a recurrent pattern for 1 week. The course has been gradually worsening. The gall stones is described as moderate. There has been associated abdominal pain and nausea. Precipitating factors include eating (dairy products and pasta). Relieving factors include nothing (time). Patient last seen [facility] ED on 7/19, elevated LFTs and normal WBC, but was told symptoms likely from reflux.    Past Surgical History Maurilio Lovely; 07/09/2018 8:58 AM) No pertinent past surgical history   Diagnostic Studies History Maurilio Lovely; 07/09/2018 8:58 AM) Colonoscopy never Mammogram never Pap Smear 1-5 years ago  Allergies Maurilio Lovely; 07/09/2018 8:59 AM) No Known Drug Allergies [07/09/2018]: Allergies Reconciled   Medication History Maurilio Lovely; 07/09/2018 8:59 AM) No Current Medications Medications Reconciled  Social History Maurilio Lovely; 07/09/2018 8:58 AM) Alcohol use  Occasional alcohol use. Caffeine use  Carbonated beverages, Tea. No drug use  Tobacco use  Former smoker.  Family History Maurilio Lovely; 07/09/2018 8:58 AM) Family history unknown  First Degree Relatives   Pregnancy / Birth History Maurilio Lovely; 07/09/2018 8:58 AM) Age at menarche  13 years. Gravida  1 Irregular periods  Length (months) of breastfeeding  3-6 Maternal age  82-25 Para  1  Other Problems Maurilio Lovely; 07/09/2018 8:58 AM) Cholelithiasis  Gastroesophageal Reflux Disease  Hemorrhoids     Review of Systems Maurilio Lovely; 07/09/2018 8:58 AM)  General Present- Fatigue and Night  Sweats. Not Present- Appetite Loss, Chills, Fever, Weight Gain and Weight Loss. Skin Present- Hives and Rash. Not Present- Change in Wart/Mole, Dryness, Jaundice, New Lesions, Non-Healing Wounds and Ulcer. HEENT Not Present- Earache, Hearing Loss, Hoarseness, Nose Bleed, Oral Ulcers, Ringing in the Ears, Seasonal Allergies, Sinus Pain, Sore Throat, Visual Disturbances, Wears glasses/contact lenses and Yellow Eyes. Respiratory Not Present- Bloody sputum, Chronic Cough, Difficulty Breathing, Snoring and Wheezing. Cardiovascular Not Present- Chest Pain, Difficulty Breathing Lying Down, Leg Cramps, Palpitations, Rapid Heart Rate, Shortness of Breath and Swelling of Extremities. Gastrointestinal Present- Abdominal Pain, Bloating, Constipation, Gets full quickly at meals and Indigestion. Not Present- Bloody Stool, Change in Bowel Habits, Chronic diarrhea, Difficulty Swallowing, Excessive gas, Hemorrhoids, Nausea, Rectal Pain and Vomiting. Female Genitourinary Not Present- Frequency, Nocturia, Painful Urination, Pelvic Pain and Urgency. Musculoskeletal Not Present- Back Pain, Joint Pain, Joint Stiffness, Muscle Pain, Muscle Weakness and Swelling of Extremities. Neurological Present- Numbness. Not Present- Decreased Memory, Fainting, Headaches, Seizures, Tingling, Tremor, Trouble walking and Weakness. Psychiatric Not Present- Anxiety, Bipolar, Change in Sleep Pattern, Depression, Fearful and Frequent crying. Endocrine Not Present- Cold Intolerance, Excessive Hunger, Hair Changes, Heat Intolerance, Hot flashes and New Diabetes. Hematology Not Present- Blood Thinners, Easy Bruising, Excessive bleeding, Gland problems, HIV and Persistent Infections.  Vitals Maurilio Lovely; 07/09/2018 8:59 AM) 07/09/2018 8:59 AM Weight: 182 lb Height: 67in Body Surface Area: 1.94 m Body Mass Index: 28.5 kg/m  Temp.: 98.22F(Oral)  Pulse: 95 (Regular)  BP: 124/80 (Sitting, Left Arm, Standard) BP today 139/87, P  74   Physical Exam (Sharon Gross O. Sharon Spruce Gross; 07/09/2018 9:24 AM) General Mental Status  Alert. General AppearanceCooperative and Well groomed. Orientation :Oriented X4. Build & Nutrition Asthenic and Well nourished.  Chest and Lung Exam Chest and lung exam reveals normal excursion with symmetric chest walls, quiet, even and easy respiratory effort with no use of accessory muscles, non-tender and normal tactile fremitus and on auscultation, normal breath sounds, no adventitious sounds and normal vocal resonance.  Cardiovascular Cardiovascular examination reveals normal heart sounds, regular rate and rhythm with no murmurs and femoral artery auscultation bilaterally reveals normal pulses, no bruits, no thrills.  Abdomen Inspection Inspection of the abdomen reveals - No Visible peristalsis and No Hernias. Palpation/Percussion Tenderness - Right Upper Quadrant(Not severe). Auscultation Auscultation of the abdomen reveals - Bowel sounds normal.    Assessment & Plan Sharon Gross(Sharon Gross O. Sharon Mincy Gross; 07/09/2018 9:26 AM) SYMPTOMATIC CHOLELITHIASIS (K80.20) Story: Multiple episodes of abdominal pain associated with nausea, no vomiting. No fevers or chills, but the last time the patient noted dark urine. Tbili slightly increased. Drank a lot of water and urine remained dark. Impression: Symptomatic cholelithiasis, likely passed a CBD stone last ED visit. Needs cholecystectomy, laparoscopic very likely.  Patient wants to schedule operation. Current Plans: Laparoscopic cholecystectomy with IOC.  Likley passed a stone recently  For Lap chole with IOC.  Sharon LamasJames O. Gae BonWyatt, III, Gross, FACS [phone] [phone]--office [facility]

## 2018-07-12 NOTE — Anesthesia Postprocedure Evaluation (Signed)
Anesthesia Post Note  Patient: Sharon Gross  Procedure(s) Performed: LAPAROSCOPIC CHOLECYSTECTOMY WITH INTRAOPERATIVE CHOLANGIOGRAM ERAS PATHWAY (N/A Abdomen)     Patient location during evaluation: PACU Anesthesia Type: General Level of consciousness: awake and alert Pain management: pain level controlled Vital Signs Assessment: post-procedure vital signs reviewed and stable Respiratory status: spontaneous breathing, nonlabored ventilation and respiratory function stable Cardiovascular status: blood pressure returned to baseline and stable Postop Assessment: no apparent nausea or vomiting Anesthetic complications: no    Last Vitals:  Vitals:   07/12/18 1206 07/12/18 1221  BP: 131/89 127/76  Pulse: 77   Resp: 15   Temp: 36.5 C   SpO2: 100%     Last Pain:  Vitals:   07/12/18 1151  TempSrc:   PainSc: 4                  Beryle Lathehomas E Brock

## 2018-07-13 ENCOUNTER — Encounter (HOSPITAL_COMMUNITY): Payer: Self-pay | Admitting: General Surgery

## 2019-02-22 IMAGING — RF DG CHOLANGIOGRAM OPERATIVE
1 series · 4 of 4 positions shown · non-contrast
Comparison: None

CLINICAL DATA: 22-year-old female undergoing cholecystectomy

EXAM:
INTRAOPERATIVE CHOLANGIOGRAM
TECHNIQUE: Cholangiographic images from the C-arm fluoroscopic device were
submitted for interpretation post-operatively. Please see the
procedural report for the amount of contrast and the fluoroscopy
time utilized.

[Series 1: run · 4 of 95 frames shown]
[frame 15/95]
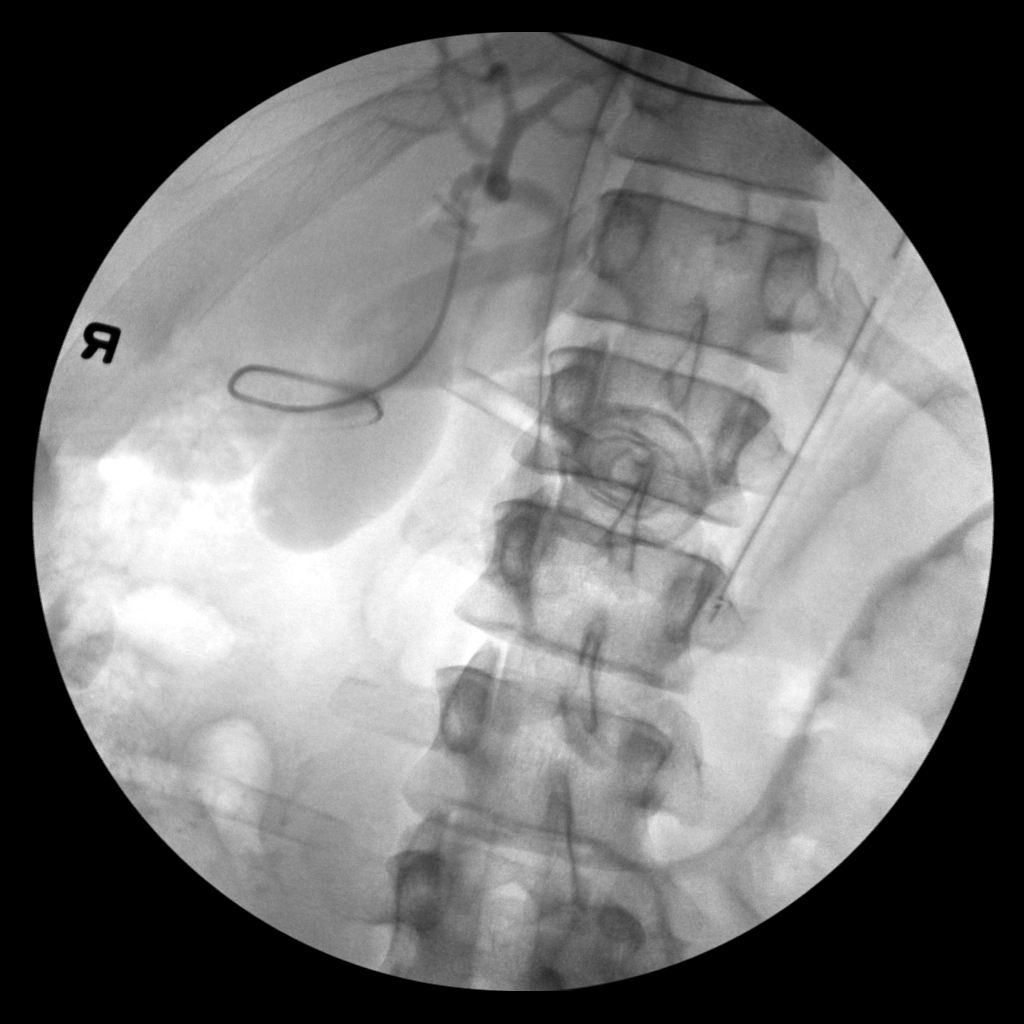
[frame 48/95]
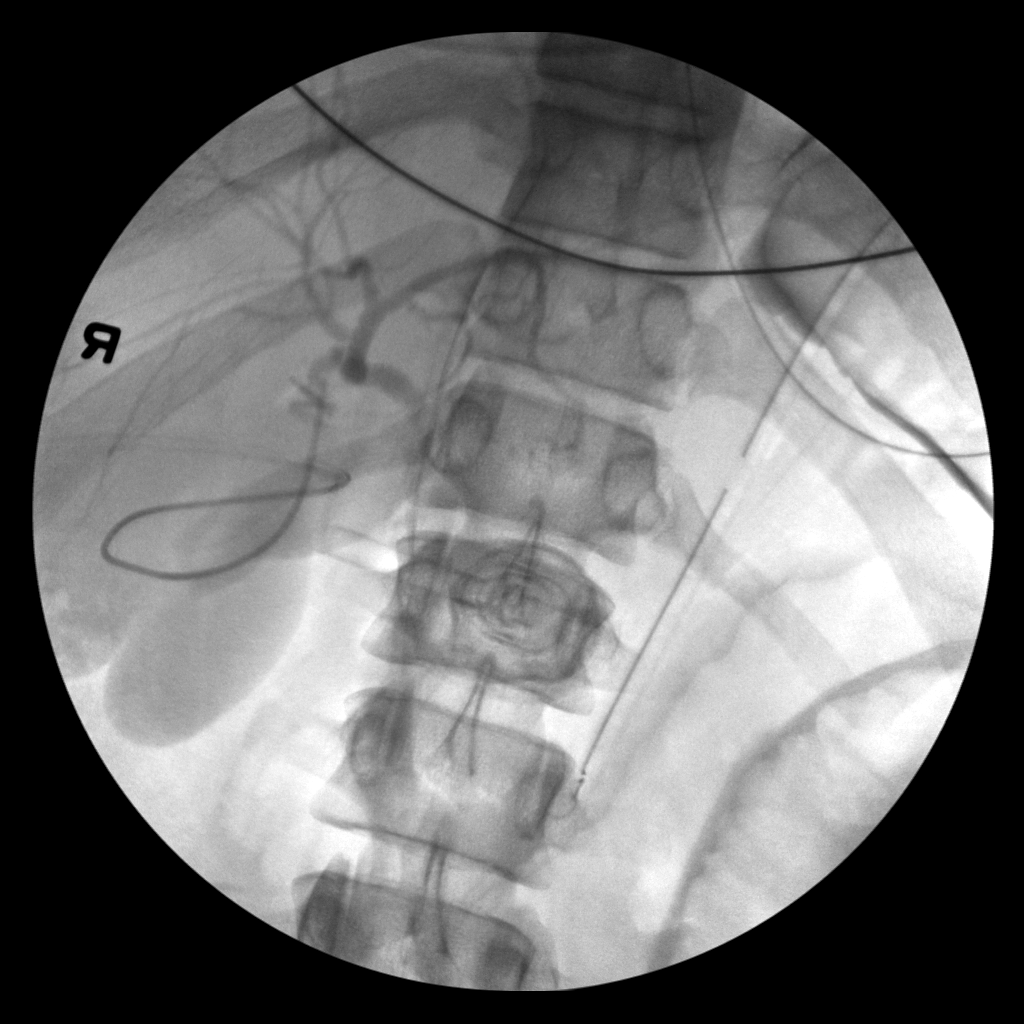
[frame 81/95]
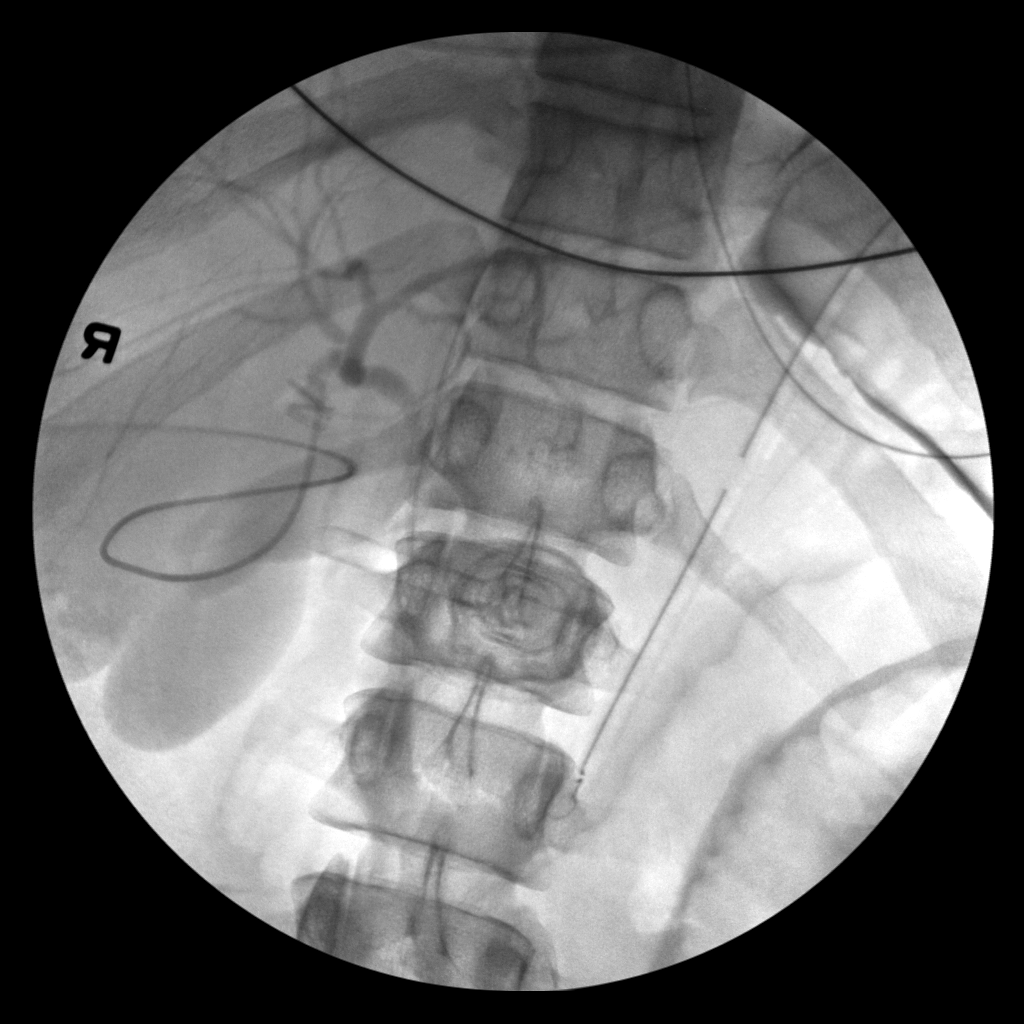
[frame 84/95]
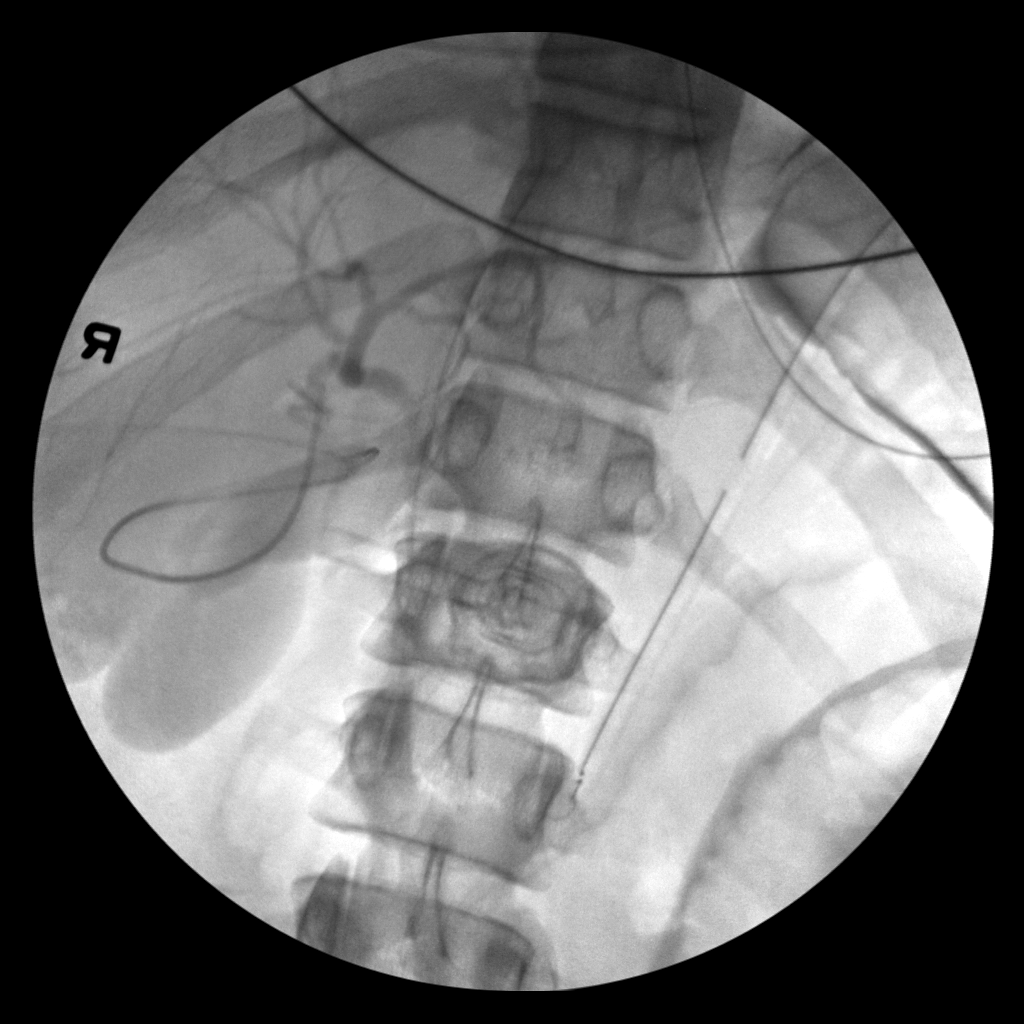

[4 of 4 positions shown; findings below may reference images not displayed]

FINDINGS: A single cine clip is submitted for review. The images demonstrate
cannulation of the cystic duct remanent and opacification of the
biliary tree. No evidence of biliary ductal dilatation, stenosis or
stricture. The distal common bile duct is not well opacified. No
definite contrast within the ampulla.
IMPRESSION: Limited intraoperative cholangiogram. While there is no evidence of
biliary ductal dilatation, the distal common bile duct is not
definitively visualized.

## 2020-03-27 ENCOUNTER — Ambulatory Visit: Payer: Self-pay | Attending: Internal Medicine

## 2020-03-27 DIAGNOSIS — Z23 Encounter for immunization: Secondary | ICD-10-CM

## 2020-03-27 NOTE — Progress Notes (Signed)
   Covid-19 Vaccination Clinic  Name:  NAZARETH NORENBERG    MRN: [id number] DOB: 23-Jul-1996  03/27/2020  Ms. Mazariego was observed post Covid-19 immunization for 15 minutes without incident. She was provided with Vaccine Information Sheet and instruction to access the V-Safe system.   Ms. Malson was instructed to call 911 with any severe reactions post vaccine: Marland Kitchen Difficulty breathing  . Swelling of face and throat  . A fast heartbeat  . A bad rash all over body  . Dizziness and weakness   Immunizations Administered    Name Date Dose VIS Date Route   Pfizer COVID-19 Vaccine 03/27/2020  2:52 PM 0.3 mL 11/28/2019 Intramuscular   Manufacturer: [employer], [employer]   Lot: [id number]   NDC: [address]-1

## 2020-04-19 ENCOUNTER — Ambulatory Visit: Payer: Medicaid Other | Attending: Internal Medicine

## 2020-04-19 DIAGNOSIS — Z23 Encounter for immunization: Secondary | ICD-10-CM

## 2020-04-19 NOTE — Progress Notes (Signed)
   Covid-19 Vaccination Clinic  Name:  Sharon Gross    MRN: [id number] DOB: [birth year]  04/19/2020  [name] was observed post Covid-19 immunization for 15 minutes without incident. She was provided with Vaccine Information Sheet and instruction to access the V-Safe system.   [name] was instructed to call 911 with any severe reactions post vaccine: Marland Kitchen Difficulty breathing  . Swelling of face and throat  . A fast heartbeat  . A bad rash all over body  . Dizziness and weakness   Immunizations Administered    Name Date Dose VIS Date Route   Pfizer COVID-19 Vaccine 04/19/2020 12:51 PM 0.3 mL 02/11/2019 Intramuscular   Manufacturer: [employer], [employer]   Lot: [id number]   NDC: [address]-0

## 2020-05-14 IMAGING — US US ABDOMEN LIMITED
1 series · 14 of 25 positions shown · non-contrast
Comparison: None.

CLINICAL DATA: Epigastric region pain

EXAM:
ULTRASOUND ABDOMEN LIMITED RIGHT UPPER QUADRANT

[Series 1: us abdomen limited · 0.18mm/px · 14 of 39 slices shown]
[im 1/39]
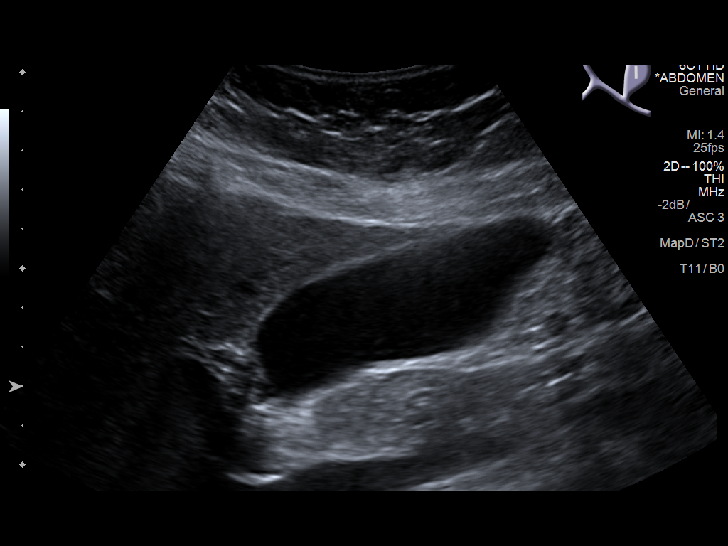
[im 4/39]
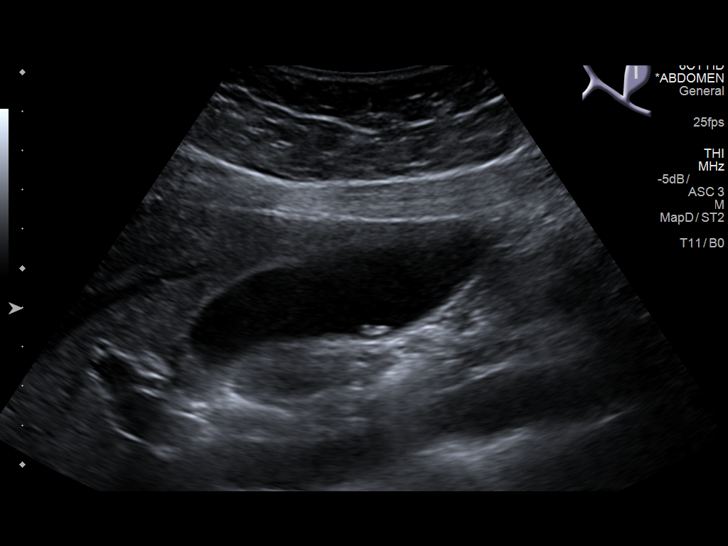
[im 7/39]
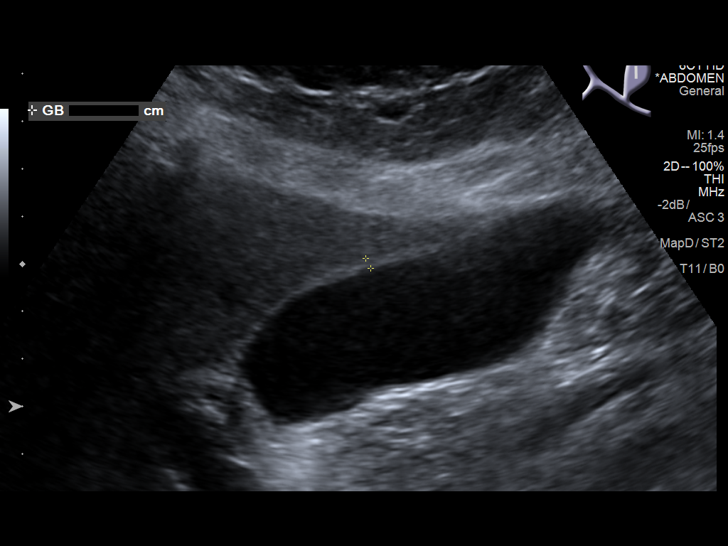
[im 10/39]
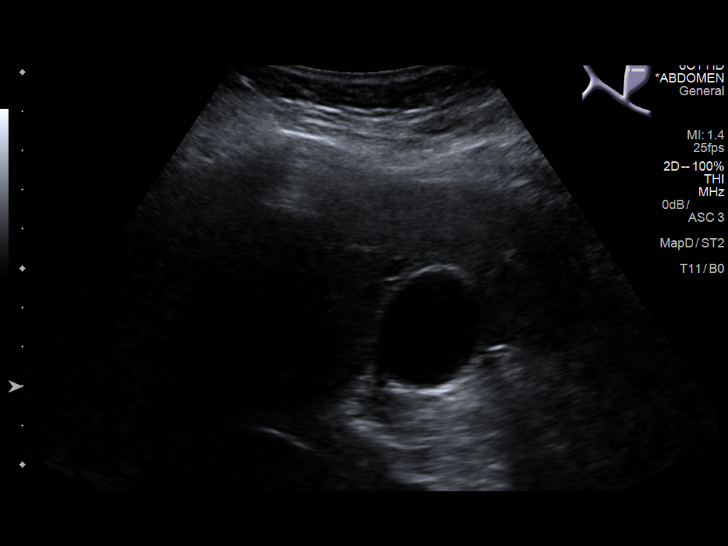
[im 13/39]
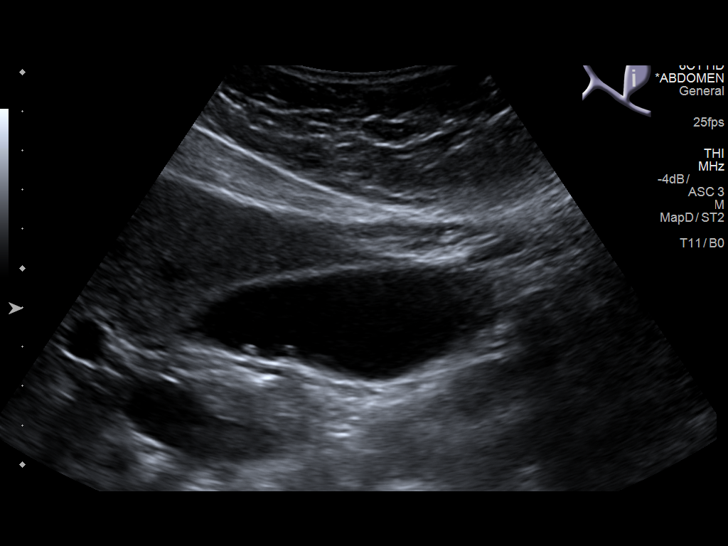
[im 15/39]
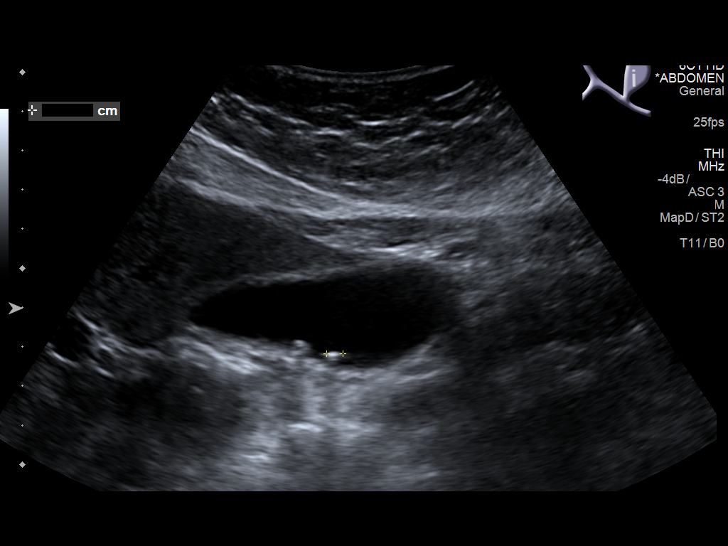
[im 18/39]
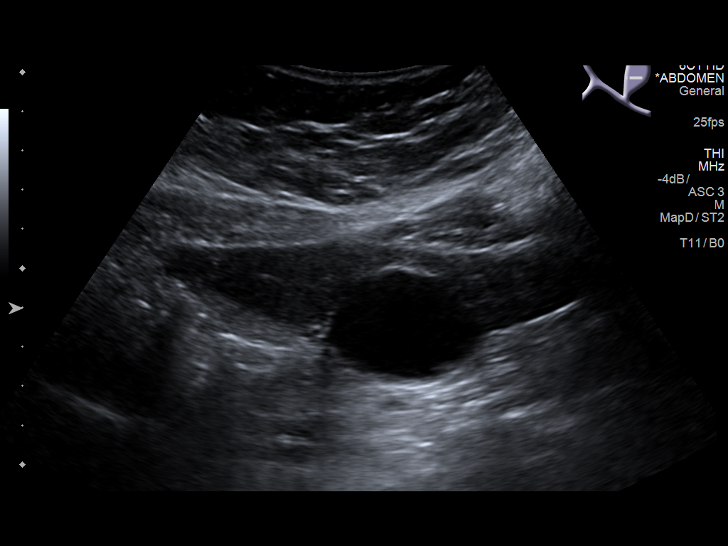
[im 21/39]
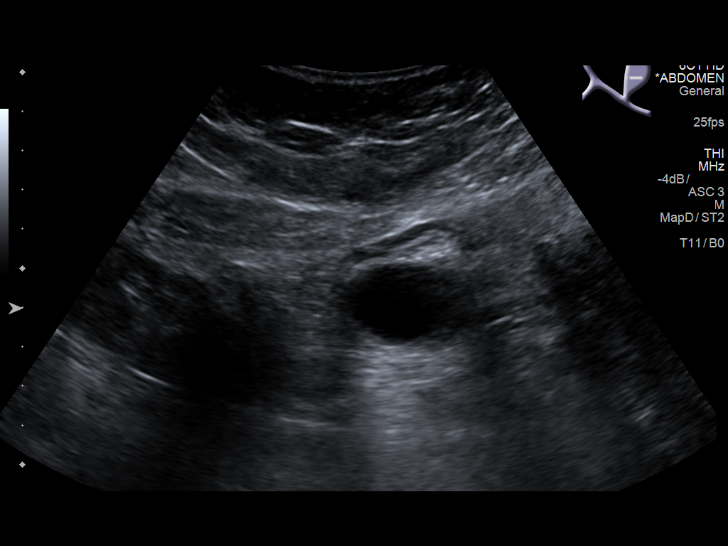
[im 24/39]
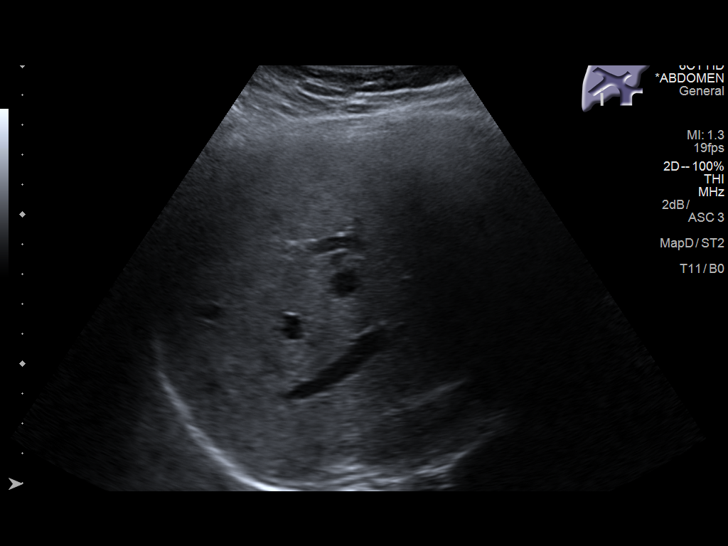
[im 26/39]
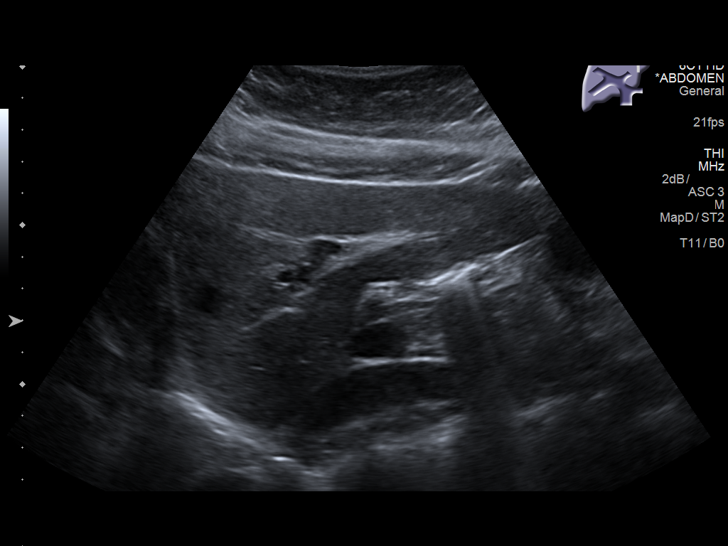
[im 29/39]
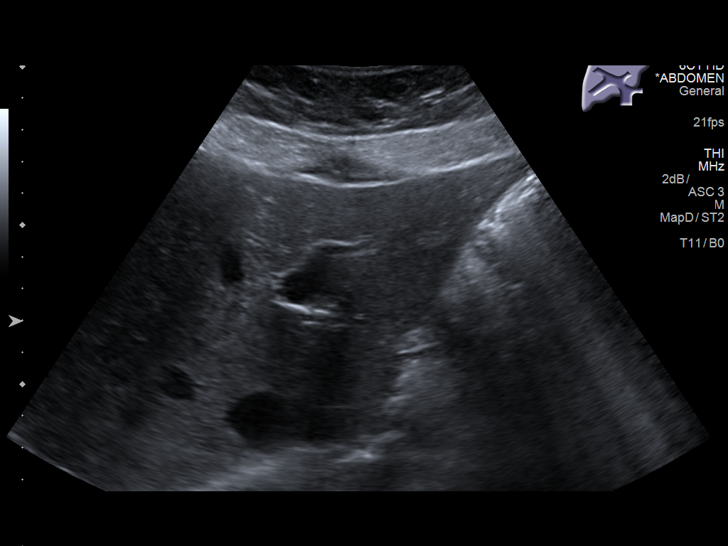
[im 32/39]
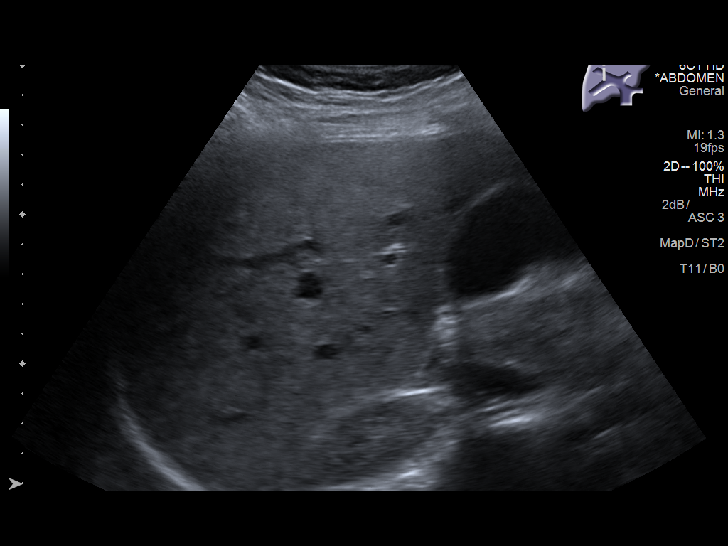
[im 35/39]
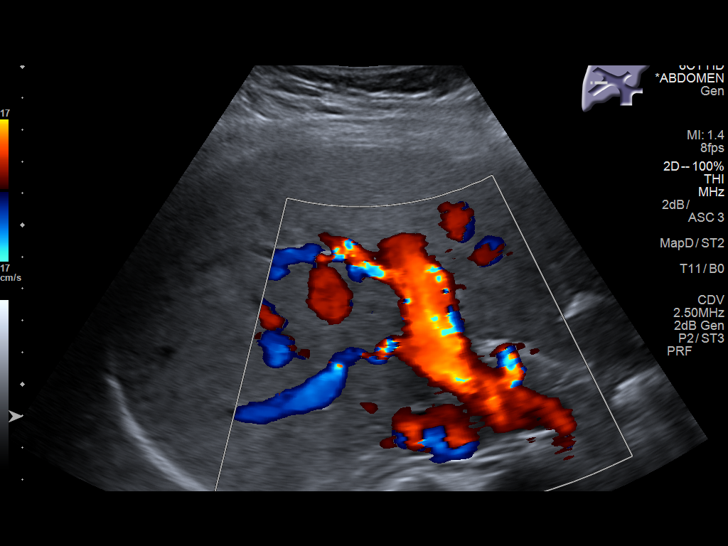
[im 39/39]
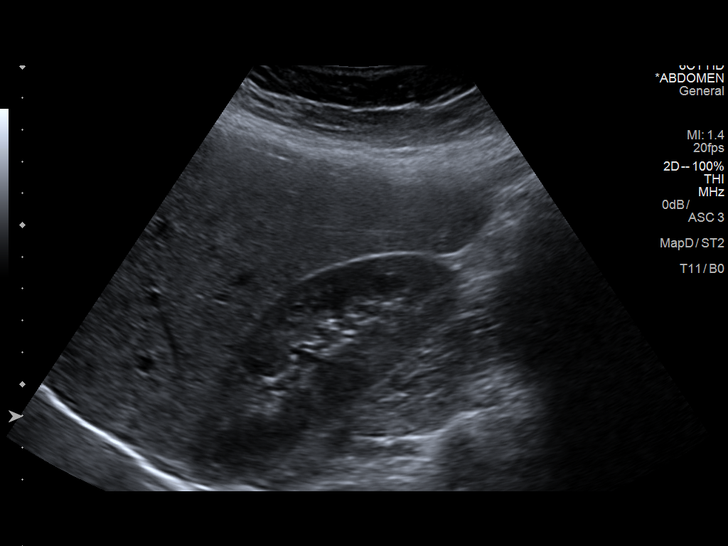

[14 of 25 positions shown; findings below may reference images not displayed]

FINDINGS: Gallbladder:

There are a few small echogenic foci in the gallbladder which move
and shadow consistent with cholelithiasis. Largest gallstone
measures 4 mm in length. No gallbladder wall thickening or
pericholecystic fluid. No sonographic Murphy sign noted by
sonographer.

Common bile duct:

Diameter: 3 mm. No intrahepatic or extrahepatic biliary duct
dilatation.

Liver:

No focal lesion identified. Within normal limits in parenchymal
echogenicity. Portal vein is patent on color Doppler imaging with
normal direction of blood flow towards the liver.
IMPRESSION: Cholelithiasis. No gallbladder wall thickening or pericholecystic
fluid. Study otherwise unremarkable.

## 2022-07-19 ENCOUNTER — Emergency Department (HOSPITAL_BASED_OUTPATIENT_CLINIC_OR_DEPARTMENT_OTHER)
Admission: EM | Admit: 2022-07-19 | Discharge: 2022-07-19 | Disposition: A | Discharge status: Home / Self Care | Payer: BLUE CROSS/BLUE SHIELD | Attending: Emergency Medicine | Admitting: Emergency Medicine

## 2022-07-19 ENCOUNTER — Emergency Department (HOSPITAL_BASED_OUTPATIENT_CLINIC_OR_DEPARTMENT_OTHER): Payer: BLUE CROSS/BLUE SHIELD

## 2022-07-19 ENCOUNTER — Encounter (HOSPITAL_BASED_OUTPATIENT_CLINIC_OR_DEPARTMENT_OTHER): Payer: Self-pay | Admitting: Urology

## 2022-07-19 DIAGNOSIS — R079 Chest pain, unspecified: Secondary | ICD-10-CM | POA: Diagnosis not present

## 2022-07-19 DIAGNOSIS — R519 Headache, unspecified: Secondary | ICD-10-CM | POA: Insufficient documentation

## 2022-07-19 LAB — CBC WITH DIFFERENTIAL/PLATELET
Abs Immature Granulocytes: 0.01 10*3/uL (ref 0.00–0.07)
Basophils Absolute: 0 10*3/uL (ref 0.0–0.1)
Basophils Relative: 0 %
Eosinophils Absolute: 0.1 10*3/uL (ref 0.0–0.5)
Eosinophils Relative: 1 %
HCT: 41.4 % (ref 36.0–46.0)
Hemoglobin: 13.9 g/dL (ref 12.0–15.0)
Immature Granulocytes: 0 %
Lymphocytes Relative: 44 %
Lymphs Abs: 2.8 10*3/uL (ref 0.7–4.0)
MCH: 29.5 pg (ref 26.0–34.0)
MCHC: 33.6 g/dL (ref 30.0–36.0)
MCV: 87.9 fL (ref 80.0–100.0)
Monocytes Absolute: 0.5 10*3/uL (ref 0.1–1.0)
Monocytes Relative: 8 %
Neutro Abs: 3 10*3/uL (ref 1.7–7.7)
Neutrophils Relative %: 47 %
Platelets: 187 10*3/uL (ref 150–400)
RBC: 4.71 MIL/uL (ref 3.87–5.11)
RDW: 13.4 % (ref 11.5–15.5)
WBC: 6.4 10*3/uL (ref 4.0–10.5)
nRBC: 0 % (ref 0.0–0.2)

## 2022-07-19 LAB — BASIC METABOLIC PANEL
Anion gap: 8 (ref 5–15)
BUN: 13 mg/dL (ref 6–20)
CO2: 25 mmol/L (ref 22–32)
Calcium: 8.9 mg/dL (ref 8.9–10.3)
Chloride: 103 mmol/L (ref 98–111)
Creatinine, Ser: 0.75 mg/dL (ref 0.44–1.00)
GFR, Estimated: >60 mL/min (ref >60)
Glucose, Bld: 91 mg/dL (ref 70–99)
Potassium: 3.6 mmol/L (ref 3.5–5.1)
Sodium: 136 mmol/L (ref 135–145)

## 2022-07-19 MED ORDER — PROCHLORPERAZINE EDISYLATE 10 MG/2ML IJ SOLN
10.0000 mg | Freq: Once | INTRAMUSCULAR | Status: AC
  Administered 2022-07-19: 10 mg via INTRAVENOUS
  Filled 2022-07-19: qty 2

## 2022-07-19 MED ORDER — DIPHENHYDRAMINE HCL 50 MG/ML IJ SOLN
12.5000 mg | Freq: Once | INTRAMUSCULAR | Status: AC
  Administered 2022-07-19: 12.5 mg via INTRAVENOUS
  Filled 2022-07-19: qty 1

## 2022-07-19 MED ORDER — SODIUM CHLORIDE 0.9 % IV BOLUS
1000.0000 mL | Freq: Once | INTRAVENOUS | Status: AC
  Administered 2022-07-19: 1000 mL via INTRAVENOUS

## 2022-07-19 NOTE — ED Triage Notes (Signed)
Pt states chest pain and headache intermittent x 2 weeks  States feel anxious  Denies any SOB, Denies N/V

## 2022-07-19 NOTE — ED Provider Notes (Signed)
[facility] EMERGENCY DEPARTMENT Provider Note   CSN: [id number] Arrival date & time: 07/19/22  1918     History  Chief Complaint  Patient presents with   Headache   Chest Pain    Sharon Gross is a 26 y.o. female.  Patient here mostly for headaches for the last 2 weeks on and off .  Worsening occipital type headaches at times.  Feels anxious some chest pain.  Denies any shortness of breath, nausea, vomiting.  Nothing makes it worse or better.  Denies any fevers or chills.  Denies any cough or sputum production.  No recent surgery or travel.  No history of blood clot.  She is not on estrogen.  Denies any weakness, numbness.  No vision loss.  Normal speech.  Normal strength and sensation.  The history is provided by the patient.       Home Medications Prior to Admission medications   Medication Sig Start Date End Date Taking? Authorizing Provider  oxyCODONE (OXY IR/ROXICODONE) 5 MG immediate release tablet Take 1 tablet (5 mg total) by mouth every 6 (six) hours as needed for severe pain. 07/12/18   Jimmye Norman, MD      Allergies    Patient has no known allergies.    Review of Systems   Review of Systems  Physical Exam Updated Vital Signs  ED Triage Vitals  Enc Vitals Group     BP 07/19/22 1930 (!) 145/106     Pulse Rate 07/19/22 1930 70     Resp 07/19/22 1930 13     Temp 07/19/22 1930 98.9 F (37.2 C)     Temp Source 07/19/22 1930 Oral     SpO2 07/19/22 1930 100 %     Weight 07/19/22 1928 175 lb 14.8 oz (79.8 kg)     Height 07/19/22 1928 5\' 7"  (1.702 m)     Head Circumference --      Peak Flow --      Pain Score 07/19/22 1928 4     Pain Loc --      Pain Edu? --      Excl. in GC? --     Physical Exam Vitals and nursing note reviewed.  Constitutional:      General: She is not in acute distress.    Appearance: She is well-developed.  HENT:     Head: Normocephalic and atraumatic.     Mouth/Throat:     Mouth: Mucous membranes are moist.   Eyes:     Extraocular Movements: Extraocular movements intact.     Conjunctiva/sclera: Conjunctivae normal.     Pupils: Pupils are equal, round, and reactive to light.  Cardiovascular:     Rate and Rhythm: Normal rate and regular rhythm.     Heart sounds: Normal heart sounds. No murmur heard. Pulmonary:     Effort: Pulmonary effort is normal. No respiratory distress.     Breath sounds: Normal breath sounds.  Abdominal:     General: Bowel sounds are normal.     Palpations: Abdomen is soft.     Tenderness: There is no abdominal tenderness.  Musculoskeletal:        General: No swelling.     Cervical back: Normal range of motion and neck supple.  Skin:    General: Skin is warm and dry.     Capillary Refill: Capillary refill takes less than 2 seconds.  Neurological:     Mental Status: She is alert.     Comments: 5+  out of 5 strength throughout, normal sensation, no drift, normal finger-nose-finger, normal speech  Psychiatric:        Mood and Affect: Mood normal.     ED Results / Procedures / Treatments   Labs (all labs ordered are listed, but only abnormal results are displayed) Labs Reviewed  CBC WITH DIFFERENTIAL/PLATELET  BASIC METABOLIC PANEL    EKG EKG Interpretation  Date/Time:  Wednesday July 19 2022 19:46:56 EDT Ventricular Rate:  71 PR Interval:  198 QRS Duration: 84 QT Interval:  393 QTC Calculation: 428 R Axis:   81 Text Interpretation: Sinus rhythm Confirmed by Virgina Norfolk ([id number]) on 07/19/2022 8:06:16 PM  Radiology CT Head Wo Contrast  Result Date: 07/19/2022 CLINICAL DATA:  Headache, new or worsening, neuro deficit (Age 52-49y) EXAM: CT HEAD WITHOUT CONTRAST TECHNIQUE: Contiguous axial images were obtained from the base of the skull through the vertex without intravenous contrast. RADIATION DOSE REDUCTION: This exam was performed according to the departmental dose-optimization program which includes automated exposure control, adjustment of the mA and/or  kV according to patient size and/or use of iterative reconstruction technique. COMPARISON:  None Available. FINDINGS: Brain: No intracranial hemorrhage, mass effect, or midline shift. No hydrocephalus. The basilar cisterns are patent. No evidence of territorial infarct or acute ischemia. No extra-axial or intracranial fluid collection. Vascular: No hyperdense vessel or unexpected calcification. Skull: Normal. Negative for fracture or focal lesion. Sinuses/Orbits: Paranasal sinuses and mastoid air cells are clear. The visualized orbits are unremarkable. Other: None. IMPRESSION: Negative noncontrast head CT. Electronically Signed   By: Narda Rutherford M.D.   On: 07/19/2022 19:50    Procedures Procedures    Medications Ordered in ED Medications  prochlorperazine (COMPAZINE) injection 10 mg (10 mg Intravenous Given 07/19/22 2023)  diphenhydrAMINE (BENADRYL) injection 12.5 mg (12.5 mg Intravenous Given 07/19/22 2023)  sodium chloride 0.9 % bolus 1,000 mL (1,000 mLs Intravenous New Bag/Given 07/19/22 2023)    ED Course/ Medical Decision Making/ A&P                           Medical Decision Making Amount and/or Complexity of Data Reviewed Labs: ordered. Radiology: ordered.  Risk Prescription drug management.   Sharon Gross is here primarily for headache.  Normal vitals.  No fever.  Fairly new headaches for her over the last 2 weeks.  Some anxiety type symptoms.  Including chest pain, tearfulness.  Overall she has no cardiac risk factors.  Heart score is 0.  EKG per my review and interpretation shows sinus rhythm.  No concern for ACS.  She is PERC negative and doubt PE.  Headaches are fairly new and we will get a head CT for further evaluation.  We will give headache cocktail with Compazine and Benadryl.  Will check CBC and BMP to look for any electrolyte abnormalities.  Differential diagnosis is likely tension/migraine type headaches.  I have no concern for ACS or PE.  I have no concern for  stroke or infectious process including meningitis.  Per my review and interpretation of lab work, there is no significant anemia, electrolyte abnormality, kidney injury.  Overall head CT is unremarkable.  She is feeling better after headache cocktail.  Suspect tension type headache.  Discharged in good condition.  This chart was dictated using voice recognition software.  Despite best efforts to proofread,  errors can occur which can change the documentation meaning.         Final Clinical Impression(s) /  ED Diagnoses Final diagnoses:  Bad headache    Rx / DC Orders ED Discharge Orders     None         Virgina Norfolk, DO 07/19/22 2049

## 2023-01-30 NOTE — Progress Notes (Signed)
Pt does not have a history of high blood pressure
